# Patient Record
Sex: Female | Born: 1950
Health system: Southern US, Community
[De-identification: ages and names within clinical notes are randomized; demographics above are authoritative.]

## PROBLEM LIST (undated history)

## (undated) DIAGNOSIS — Z923 Personal history of irradiation: Secondary | ICD-10-CM

## (undated) DIAGNOSIS — Z8489 Family history of other specified conditions: Secondary | ICD-10-CM

## (undated) DIAGNOSIS — E785 Hyperlipidemia, unspecified: Secondary | ICD-10-CM

## (undated) DIAGNOSIS — M1990 Unspecified osteoarthritis, unspecified site: Secondary | ICD-10-CM

## (undated) DIAGNOSIS — M8580 Other specified disorders of bone density and structure, unspecified site: Secondary | ICD-10-CM

## (undated) DIAGNOSIS — C541 Malignant neoplasm of endometrium: Secondary | ICD-10-CM

## (undated) DIAGNOSIS — E669 Obesity, unspecified: Secondary | ICD-10-CM

## (undated) DIAGNOSIS — T7840XA Allergy, unspecified, initial encounter: Secondary | ICD-10-CM

## (undated) DIAGNOSIS — I10 Essential (primary) hypertension: Secondary | ICD-10-CM

## (undated) DIAGNOSIS — H5440 Blindness, one eye, unspecified eye: Secondary | ICD-10-CM

## (undated) DIAGNOSIS — H3320 Serous retinal detachment, unspecified eye: Secondary | ICD-10-CM

## (undated) HISTORY — DX: Family history of other specified conditions: Z84.89

## (undated) HISTORY — DX: Personal history of irradiation: Z92.3

## (undated) HISTORY — DX: Unspecified osteoarthritis, unspecified site: M19.90

## (undated) HISTORY — DX: Blindness, one eye, unspecified eye: H54.40

## (undated) HISTORY — DX: Obesity, unspecified: E66.9

## (undated) HISTORY — DX: Serous retinal detachment, unspecified eye: H33.20

## (undated) HISTORY — DX: Other specified disorders of bone density and structure, unspecified site: M85.80

## (undated) HISTORY — DX: Malignant neoplasm of endometrium: C54.1

## (undated) HISTORY — DX: Allergy, unspecified, initial encounter: T78.40XA

## (undated) HISTORY — DX: Hyperlipidemia, unspecified: E78.5

---
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
## 2011-02-15 ENCOUNTER — Other Ambulatory Visit (HOSPITAL_COMMUNITY)
Admission: RE | Admit: 2011-02-15 | Discharge: 2011-02-15 | Disposition: A | Discharge status: Home / Self Care | Payer: BC Managed Care – PPO | Source: Ambulatory Visit | Attending: Obstetrics and Gynecology | Admitting: Obstetrics and Gynecology

## 2011-02-15 DIAGNOSIS — Z01419 Encounter for gynecological examination (general) (routine) without abnormal findings: Secondary | ICD-10-CM | POA: Insufficient documentation

## 2011-02-23 ENCOUNTER — Other Ambulatory Visit: Payer: Self-pay | Admitting: Obstetrics and Gynecology

## 2011-02-23 DIAGNOSIS — Z1231 Encounter for screening mammogram for malignant neoplasm of breast: Secondary | ICD-10-CM

## 2011-03-09 ENCOUNTER — Ambulatory Visit
Admission: RE | Admit: 2011-03-09 | Discharge: 2011-03-09 | Disposition: A | Discharge status: Home / Self Care | Payer: BC Managed Care – PPO | Source: Ambulatory Visit | Attending: Obstetrics and Gynecology | Admitting: Obstetrics and Gynecology

## 2011-03-09 DIAGNOSIS — Z1231 Encounter for screening mammogram for malignant neoplasm of breast: Secondary | ICD-10-CM

## 2011-03-10 ENCOUNTER — Other Ambulatory Visit: Payer: Self-pay | Admitting: Obstetrics and Gynecology

## 2011-03-10 DIAGNOSIS — R928 Other abnormal and inconclusive findings on diagnostic imaging of breast: Secondary | ICD-10-CM

## 2011-03-14 ENCOUNTER — Ambulatory Visit
Admission: RE | Admit: 2011-03-14 | Discharge: 2011-03-14 | Disposition: A | Discharge status: Home / Self Care | Payer: BC Managed Care – PPO | Source: Ambulatory Visit | Attending: Obstetrics and Gynecology | Admitting: Obstetrics and Gynecology

## 2011-03-14 DIAGNOSIS — R928 Other abnormal and inconclusive findings on diagnostic imaging of breast: Secondary | ICD-10-CM

## 2011-03-29 ENCOUNTER — Ambulatory Visit: Payer: BC Managed Care – PPO | Attending: Gynecologic Oncology | Admitting: Gynecologic Oncology

## 2011-03-29 DIAGNOSIS — D235 Other benign neoplasm of skin of trunk: Secondary | ICD-10-CM | POA: Insufficient documentation

## 2011-03-29 DIAGNOSIS — Z800 Family history of malignant neoplasm of digestive organs: Secondary | ICD-10-CM | POA: Insufficient documentation

## 2011-03-29 DIAGNOSIS — D259 Leiomyoma of uterus, unspecified: Secondary | ICD-10-CM | POA: Insufficient documentation

## 2011-03-29 DIAGNOSIS — N950 Postmenopausal bleeding: Secondary | ICD-10-CM | POA: Insufficient documentation

## 2011-03-29 DIAGNOSIS — Z87891 Personal history of nicotine dependence: Secondary | ICD-10-CM | POA: Insufficient documentation

## 2011-03-29 DIAGNOSIS — N8502 Endometrial intraepithelial neoplasia [EIN]: Secondary | ICD-10-CM | POA: Insufficient documentation

## 2011-03-30 NOTE — Consult Note (Signed)
NAMEALOMA, [name]           ACCOUNT NO.:  [id number]  MEDICAL RECORD NO.:  [id number]           PATIENT TYPE:  O  LOCATION:  GYN                          FACILITY:  [facility]  PHYSICIAN:  Gearline Spilman A. Duard Brady, MD    DATE OF BIRTH:  1951/05/25  DATE OF CONSULTATION:  03/29/2011 DATE OF DISCHARGE:                                CONSULTATION   REASON FOR CONSULTATION:  The patient is seen today in consultation at the request of Dr. Gevena Cotton.  HISTORY:  Ms. [name] is a very pleasant 60 year old gravida 0, para 0 whose last cycle was about 10 years ago.  She presents with about 6 months' history of postmenopausal bleeding.  There has really been no pain associated with it.  It was bright red.  It was fairly sporadic. There was no pattern to it.  She has really had no bleeding for the past month.  She did see Dr. Gevena Cotton and underwent an ultrasound on February 23, 2011, that revealed the uterus to be 4.4 x 6.5 x 4.4 cm.  Normal appearing adnexa.  There were small anterior fibroids measuring 2 to 3 cm.  The endometrium was thickened, measuring about 1.1 x 0.9 cm with blood flow throughout the endometrial cavity.  She underwent an endometrial biopsy on April 4 that revealed at least complex atypical hyperplasia.  Could not rule out endometrioid adenocarcinoma.  She otherwise has no complaints.  She has had very limited health care.  She had a mammogram last week.  She has never had a colonoscopy.  The mammogram that she had last week was her first one.  REVIEW OF SYSTEMS:  She denies any chest pain, shortness of breath, nausea, vomiting, fevers, chills, headaches or visual changes.  She does complain of a lump on her right side that she felt recently.  MEDICATIONS:  Claritin p.r.n. and Benadryl p.r.n.  PAST MEDICAL HISTORY:  None, but health care has been limited.  PAST SURGICAL HISTORY:  Wisdom tooth extraction in 1973.  SOCIAL HISTORY:  She has a history of smoking half a pack per day  for approximately a 30 pack year history.  However, she quit two months ago. She drinks about one drink per month.  She works as an Airline pilot at [employer].  She is single.  FAMILY HISTORY:  Her father died of a cardiac aneurysm at the age of 31. Her mother died at the age of 20.  She also had colon cancer.  She has a brother who died of colon cancer at the age of 50.  She has a brother who has kidney failure and he also has malignant hyperthermia.  She herself has been tested and was "borderline".  HEALTH MAINTENANCE:  She has never used any birth control.  She has never had a Pap smear.  She has never had a mammogram prior to the one last week.  She has never had a colonoscopy.  PHYSICAL EXAMINATION:  VITAL SIGNS:  Height 5 feet 9, weight 198 pounds, BMI 28, blood pressure 122/88, pulse 64, respirations 16. GENERAL:  A well-nourished, well-developed female in no acute distress. NECK:  Supple.  There is no  lymphadenopathy and no thyromegaly. LUNGS:  Clear to auscultation bilaterally. CARDIOVASCULAR EXAMINATION:  Regular rate and rhythm. ABDOMEN:  Soft, nontender, nondistended.  There is no palpable masses or hepatosplenomegaly. SKIN:  She has three small nevi on her right side and on the middle of the three small nevi there is a palpable lump measuring approximately 1.5 cm that is mobile, consistent with a lipoma. GROINS:  Negative for adenopathy. EXTREMITIES:  There is no edema. PELVIC:  External genitalia is within normal limits.  The vagina is slightly atrophic.  The cervix is visualized.  It is without lesions. It is nulliparous.  On bimanual examination the cervix is palpably normal.  The corpus is of normal size, shape and consistency.  There is no adnexal masses.  The introitus is somewhat narrowed.  ASSESSMENT AND PLAN: 72. A 60 year old with at least complex hyperplasia with atypia based     on endometrial biopsy.  I discussed with her that recommendations     would be to  proceed with a hysterectomy and removal of the adnexa.     Frozen section would be performed to determine the need for any     lymphadenectomy.  I discussed with her doing this robotically and     she is in agreement.  The risks and benefits of the procedure     including, but not limited to, bleeding, infection, injury to     surrounding organs and thromboembolic disease were discussed with     the patient.  I also discussed with her that we would send her     tumor for microsatellite instability testing due to her family     history of colon cancer in her mother and brother to ensure that     she is not carrier genetic predisposition.  She has never had a     colonoscopy and has a referral in for that and that can be done     preoperatively based on scheduling. 2. Her brother has a history of malignant hyperthermia.  She herself     was tested many, many years ago and is "borderline".  She has never     had general anesthesia.  She will require Anesthesia preoperative     clearance. 3. Her questions were elicited and answered to her satisfaction.  She has    some time commitments and wished to have surgery June 26th.  We will     arrange surgery accordingly   She has our contact information and    can contact us with any questions prior to that.     Shawnika Pepin A. Duard Brady, MD     PAG/MEDQ  D:  03/29/2011  T:  03/29/2011  Job:  [id number]  cc:   Patsy Baltimore, MD Fax: [phone]  Telford Nab, R.N. [address] [place], [place] [address]  Anna Genre. Little, M.D. Fax: [phone]  Electronically Signed by Cleda Mccreedy MD on 03/30/2011 03:17:47 PM

## 2011-05-05 ENCOUNTER — Other Ambulatory Visit: Payer: Self-pay | Admitting: Gynecologic Oncology

## 2011-05-05 ENCOUNTER — Encounter (HOSPITAL_COMMUNITY): Payer: BC Managed Care – PPO

## 2011-05-05 LAB — COMPREHENSIVE METABOLIC PANEL WITH GFR
ALT: 13 U/L (ref 0–35)
AST: 13 U/L (ref 0–37)
Albumin: 4.2 g/dL (ref 3.5–5.2)
Alkaline Phosphatase: 71 U/L (ref 39–117)
BUN: 11 mg/dL (ref 6–23)
CO2: 26 meq/L (ref 19–32)
Calcium: 9.9 mg/dL (ref 8.4–10.5)
Chloride: 109 meq/L (ref 96–112)
Creatinine, Ser: 0.64 mg/dL (ref 0.50–1.10)
GFR calc Af Amer: >60 mL/min
GFR calc non Af Amer: >60 mL/min
Glucose, Bld: 90 mg/dL (ref 70–99)
Potassium: 4.6 meq/L (ref 3.5–5.1)
Sodium: 144 meq/L (ref 135–145)
Total Bilirubin: 0.3 mg/dL (ref 0.3–1.2)
Total Protein: 6.9 g/dL (ref 6.0–8.3)

## 2011-05-05 LAB — DIFFERENTIAL
Eosinophils Relative: 0 % (ref 0–5)
Lymphocytes Relative: 15 % (ref 12–46)
Lymphs Abs: 1.3 10*3/uL (ref 0.7–4.0)
Monocytes Relative: 6 % (ref 3–12)

## 2011-05-05 LAB — CBC
MCHC: 33 g/dL (ref 30.0–36.0)
Platelets: 230 10*3/uL (ref 150–400)
RDW: 12.9 % (ref 11.5–15.5)

## 2011-05-05 LAB — SURGICAL PCR SCREEN: Staphylococcus aureus: NEGATIVE

## 2011-05-09 ENCOUNTER — Ambulatory Visit (HOSPITAL_COMMUNITY)
Admission: RE | Admit: 2011-05-09 | Discharge: 2011-05-10 | Disposition: A | Discharge status: Home / Self Care | Payer: BC Managed Care – PPO | Source: Ambulatory Visit | Attending: Obstetrics & Gynecology | Admitting: Obstetrics & Gynecology

## 2011-05-09 ENCOUNTER — Other Ambulatory Visit: Payer: Self-pay | Admitting: Gynecologic Oncology

## 2011-05-09 DIAGNOSIS — Z79899 Other long term (current) drug therapy: Secondary | ICD-10-CM | POA: Insufficient documentation

## 2011-05-09 DIAGNOSIS — Z01811 Encounter for preprocedural respiratory examination: Secondary | ICD-10-CM | POA: Insufficient documentation

## 2011-05-09 DIAGNOSIS — Z01812 Encounter for preprocedural laboratory examination: Secondary | ICD-10-CM | POA: Insufficient documentation

## 2011-05-09 DIAGNOSIS — C55 Malignant neoplasm of uterus, part unspecified: Secondary | ICD-10-CM | POA: Insufficient documentation

## 2011-05-09 DIAGNOSIS — Z01810 Encounter for preprocedural cardiovascular examination: Secondary | ICD-10-CM | POA: Insufficient documentation

## 2011-05-09 DIAGNOSIS — N8502 Endometrial intraepithelial neoplasia [EIN]: Secondary | ICD-10-CM | POA: Insufficient documentation

## 2011-05-09 HISTORY — PX: ABDOMINAL HYSTERECTOMY: SHX81

## 2011-05-09 LAB — SAMPLE TO BLOOD BANK

## 2011-05-10 LAB — CBC
Hemoglobin: 13.2 g/dL (ref 12.0–15.0)
MCH: 28.9 pg (ref 26.0–34.0)
MCV: 88.8 fL (ref 78.0–100.0)
RBC: 4.56 MIL/uL (ref 3.87–5.11)
WBC: 14.2 10*3/uL — ABNORMAL HIGH (ref 4.0–10.5)

## 2011-05-12 NOTE — Op Note (Signed)
NAMELONA, SIX           ACCOUNT NO.:  [id number]  MEDICAL RECORD NO.:  [id number]  LOCATION:  1529                         FACILITY:  [facility]  PHYSICIAN:  Hadessah Grennan A. Duard Brady, MD    DATE OF BIRTH:  [birth year]  DATE OF PROCEDURE:  05/09/2011 DATE OF DISCHARGE:                              OPERATIVE REPORT   PREOPERATIVE DIAGNOSIS:  Complex hyperplasia with atypia.  POSTOPERATIVE DIAGNOSIS:  Grade 1 endometrioid adenocarcinoma with at least 50% myometrial invasion.  PROCEDURE:  Total robotic hysterectomy, bilateral salpingo-oophorectomy, bilateral pelvic and bilateral para-aortic lymph node dissection.  SURGEON: 1. Alla Sloma A. Duard Brady, MD 2. Roseanna Rainbow, M.D.  ASSISTANT:  Telford Nab, R.N.  ANESTHESIA:  General.  ESTIMATED BLOOD LOSS:  Minimal.  URINE OUTPUT:  450 mL.  IV FLUIDS:  2000 mL.  COMPLICATIONS:  None.  SPECIMEN:  Included cervix, uterus, bilateral tubes and ovaries, bilateral pelvic and bilateral para-aortic lymph nodes to pathology.  OPERATIVE FINDINGS:  Included some physiologic adhesive disease of the rectosigmoid colon to the left pelvic sidewall and the cecum to the anterior abdominal wall.  Grossly there was no evidence of extrauterine disease.  Frozen section revealed grade 1 endometrioid adenocarcinoma with at least if not more than greater than 50% myometrial invasion.  DESCRIPTION OF PROCEDURE:  The patient was identified in the preoperative holding area as self.  Informed consent was signed on the chart.  Risks and benefits of the procedure were discussed with the patient, reviewed and she wished to proceed.  The patient was then taken to the operating room and placed in supine position.  Her arms were tucked to her side with appropriate precautions while the patient was awake to ensure comfort and safety.  General anesthesia was then induced and an OG tube was placed to suction.  She was then placed in lithotomy position with  all appropriate precautions.  The shoulder blocks were placed in the usual fashion with appropriate precautions.  Time-out was performed to confirm the patient, the procedure, antibiotic, allergy status.  The perineum was closed with Betadine cleaning solution.  Foley catheter in the bladder under sterile conditions.  The patient had incredibly narrow introitus.  Sterile speculum was placed in the vagina. The cervix was grasped with single-tooth tenaculum, dilated without difficulty.  This ZUMI with a small ring was placed without difficulty. The abdomen was then prepped in usual fashion.  After waiting 3 minutes for the ChloraPrep to dry, the patient was then draped.  A second time- out was performed to confirm the patient, the procedure, antibiotic and allergy status.  OG tube placement was confirmed and to suction. Approximately 2 cm below the costal margin, midclavicular line, the skin was anesthetized using 1% lidocaine.  A 5-mm incision was made and using 5-mm Optiview direct placement of intra-abdominal port placement.  The patient's abdomen was insufflated with CO2 gas.  At this point and all points during the procedure, the patient's intra-abdominal pressure did not increase over 50 mmHg.  A 10-12 camera port was placed 24 cm above the pubic symphysis.  Bilateral 8-mm ports were then placed for the robot 10 cm lateral and 15 degrees down.  Again all these ports were placed under  direct visualization.  The small bowel was folded on its mesentery to allow access to the para-aortic lymph nodes.  The robot was docked.  We transected the round ligament with monopolar cautery and the anterior and posterior leaves of broad ligament were opened.  The ureter was identified.  The pararectal space was opened.  A window was made between IP and the ureter.  The IP was coagulated with bipolar cautery and transected.  The uterine vessels were then skeletonized posteriorly to the level of the  KOH ring.  Anteriorly using the KOH ring as a guide, the bladder flap was created to a level below the KOH.  The uterine vessels after skeletonization were coagulated and transected.  We then turned our attention to the left side.  The physiologic adhesions of the rectosigmoid colon to the left adnexa on the left pelvic sidewall were taken down using sharp dissection.  A similar procedure was performed and then we transected the round ligament.  The anterior and posterior leaves of the broad ligament were opened and the ureter was identified. The pararectal space was opened.  The uterine vessels were then skeletonized and bladder flap was completed..  The uterine vessels on the left side were coagulated with bipolar cautery and transected.  At this time, the pneumo-occluder balloon was insufflated and a colpotomy was performed.  The cervix, uterus, bilateral tubes and ovaries were delivered through the vagina without difficulty.  The pneumo-occluder balloon was replaced.  While awaiting frozen section, the paravesical spaces bilaterally were opened without difficulty.  Continued to open up the spaces for the pelvic lymph node dissection at this point, the frozen section returned as a grade 1 cancer with at least 50% myometrial invasion.  Our attention was first drawn to the nodal bundle on the patient's right side.  Extending to the level of the common, the nodal bundle extending over the external iliac artery was removed using monopolar cautery.  The genitofemoral nerve was identified and spared. This was continued down to the level of circumflex iliac.  We then came down medially and identified the external iliac vein and the nodal bundle was removed from that and transected.  The grasper from the assistant port was then placed in the paravesical space.  The obturator nerve was identified.  The nodal bundle superior to the obturator nerve was taken down using sharp dissection.  There was  an accessory obturator artery coursing through the nodal bundle and this was spared with careful dissection.  A similar procedure was performed on the patient's left side.  Both nodal specimens were placed in EndoCatch bag.  Our attention was then drawn to the para-aortics. There was good and adequate exposure.  An incision was conducted from the common up towards the duodenum using monopolar cautery.  The retroperitoneal space was opened.  The psoas tendon was identified and the ureter was mobilized superiorly with assistant port.  The nodal bundle extending over the vena cava was taken down using sharp dissection and pinpoint cautery. There was no pathologically enlarged nodes.  This was performed to the level of the duodenum.  The specimens were then delivered through the 10- 12 assistant port.  After obtaining hemostasis, we then turned to the left para-aortic lymph nodes.  The IMA was identified.  The peritoneum superior to the IMA was dissected open.  The retroperitoneal space was opened and the psoas tendon were identified and the ureter was held lateral with the grasper port.  The left-sided paraaortic lymph node  bundle was removed using sharp dissection and pinpoint cautery as indicated.  It was then delivered through the 10-12 port.  Ray-Tec was placed in the space and there was a small bleeder.  Reinspection confirmed that the bleeding had ceased and cautery to this area was performed.  All specimens in the EndoCatch bag and Ray-Tec were then delivered through the vagina and the pneumo-occluder balloon was replaced.  All pedicle sites including the nodal bundles sites were inspected and noted be hemostatic.  The vaginal cuff was closed using a 0 Vicryl on the CT-1 suture in a running fashion.  The abdomen and pelvis were copiously irrigated.  The needle was removed without difficulty.  The port sites and pedicles were inspected under low flow and noted be hemostatic.  The  instruments were then removed and under direct visualization the robotic ports were removed.  The pneumoperitoneum was then removed from the patient's abdomen.  The robot was undocked.  The 10-12 supraumbilical and 10-12 infracostal incisions had deep sutures of 0 Vicryl on UR-6.  Skin was closed using 4-0 Vicryl, Steri- Strips and Benzoin were applied.  The vagina was swabbed and the vaginal cuff noted to be hemostatic.  There was a small tear on the vaginal mucosa due to very narrow introitus.  Two figure-of-eight sutures were placed in this area and there was adequate hemostasis.  The patient tolerated the procedure well, was taken to the recovery room stable condition.  All instrument, needle, Ray-Tec counts were correct x2.     Tarini Carrier A. Duard Brady, MD     PAG/MEDQ  D:  05/09/2011  T:  05/09/2011  Job:  [id number]  cc:   Roseanna Rainbow, M.D. Fax: [phone]  Telford Nab, R.N. [address] [place], [place] [address]  Anna Genre. Little, M.D. Fax: [phone]  Patsy Baltimore, MD Fax: [phone]  Electronically Signed by Cleda Mccreedy MD on 05/12/2011 07:39:30 AM

## 2011-05-31 ENCOUNTER — Ambulatory Visit: Payer: BC Managed Care – PPO | Attending: Gynecologic Oncology | Admitting: Gynecologic Oncology

## 2011-05-31 DIAGNOSIS — C55 Malignant neoplasm of uterus, part unspecified: Secondary | ICD-10-CM | POA: Insufficient documentation

## 2011-06-02 NOTE — Consult Note (Signed)
NAMEAUBREANNA, [name]           ACCOUNT NO.:  [id number]  MEDICAL RECORD NO.:  [id number]  LOCATION:  GYN                          FACILITY:  [facility]  PHYSICIAN:  Severino Paolo A. Duard Brady, MD    DATE OF BIRTH:  07-01-1951  DATE OF CONSULTATION:  05/31/2011 DATE OF DISCHARGE:                                CONSULTATION   HISTORY OF PRESENT ILLNESS:  The patient is very pleasant 60 year old who is referred by Dr. Gevena Cotton.  She had a 18-month history of postmenopausal bleeding that was bright red.  She underwent an endometrial biopsy that revealed at least complex hyperplasia with atypia.  Subsequently on May 09, 2011, she underwent robotic-assisted hysterectomy, bilateral salpingo-oophorectomy.  Frozen section with a grade 1 endometrioid adenocarcinoma with at least if not more than 50% myometrial invasion.  Final pathology revealed a grade 2 endometrioid adenocarcinoma with deep myometrial invasion of 0.6 to 1.1 cm for a total of approximately 55%.  There was lymphovascular space involvement with a grade 2 lesion.  0 out of 11 lymph nodes were involved, 0 out of 1 left pelvic, 0 out of 4 right pelvic, 0 out of 5 right periaortic and 0 out of 1 left periaortic nodes being involved.  Adnexa were negative. She is therefore stage IB grade 2 endometrioid adenocarcinoma.  She comes in today for her postoperative check.  She really describes the surgery as a "nonevent."  She states she had no pain.  She has had no bleeding and feels like she is ready to go back to work today.  She and I had a lengthy discussion, spending at least 15 minutes face-to-face time discussing her pathology.  We reviewed the GOG data and at her age, she would require three risk factor, she has two including lymphovascular space involvement and grade 2 disease but she does not have sufficient myometrial invasion, though the cut off on the GOG was 66% and she has 55%.  After we discussed that, she would at least want to  speak with radiation-oncology and she is actually probably most likely interested in proceeding with postoperative vaginal cuff brachytherapy.  We had a discussion briefly regarding the pros and cons of radiation therapy.  She states she would like to be as conservative as possible and if it is something that could be done with minimal risks that would potentially decrease her risks even more, it would be something that she be willing to consider.  PHYSICAL EXAMINATION:  VITAL SIGNS:  Weight 202 pounds, height 5 feet 9 inches, blood pressure 140/80, temperature 97.8, respirations 18. GENERAL:  Well-nourished, well-developed female in no acute distress. ABDOMEN:  Well-healed surgical incisions.  No evidence of any incisional hernias. PELVIC:  External genitalia is within normal limits.  The vagina is somewhat atrophic.  The vaginal cuff is visualized.  It is healing well. Suture line intact.  There is no nodularity.  There is no fluctuance. It is nontender.  ASSESSMENT:  59-year with stage IB grade 2 endometrioid adenocarcinoma.  PLAN:  She has an appointment to see Dr. Antony Blackbird on June 14, 2011 for evaluation and consideration and discussion of postoperative radiation therapy.  Will see her in 4 months.  She will  be followed every 4 months for the first 3 years and after that point, she will be followed every 6 months for a total of 5-year followup.     [name] A. Duard Brady, MD     PAG/MEDQ  D:  05/31/2011  T:  05/31/2011  Job:  [id number]  cc:   Patsy Baltimore, MD Fax: [phone]  Anna Genre. Little, M.D. Fax: [phone]  Telford Nab, R.N. [address] [place], [place] [address]  Billie Lade, Ph.D., M.D. Fax: [phone]  Electronically Signed by Cleda Mccreedy MD on 06/02/2011 10:26:32 AM

## 2011-06-12 ENCOUNTER — Ambulatory Visit (INDEPENDENT_AMBULATORY_CARE_PROVIDER_SITE_OTHER): Payer: BC Managed Care – PPO | Admitting: Ophthalmology

## 2011-06-12 DIAGNOSIS — H33309 Unspecified retinal break, unspecified eye: Secondary | ICD-10-CM

## 2011-06-14 ENCOUNTER — Ambulatory Visit
Admission: RE | Admit: 2011-06-14 | Discharge: 2011-06-14 | Disposition: A | Discharge status: Home / Self Care | Payer: BC Managed Care – PPO | Source: Ambulatory Visit | Attending: Radiation Oncology | Admitting: Radiation Oncology

## 2011-06-14 DIAGNOSIS — Z800 Family history of malignant neoplasm of digestive organs: Secondary | ICD-10-CM | POA: Insufficient documentation

## 2011-06-14 DIAGNOSIS — Z510 Encounter for antineoplastic radiation therapy: Secondary | ICD-10-CM | POA: Insufficient documentation

## 2011-06-14 DIAGNOSIS — C549 Malignant neoplasm of corpus uteri, unspecified: Secondary | ICD-10-CM | POA: Insufficient documentation

## 2011-06-16 ENCOUNTER — Other Ambulatory Visit (INDEPENDENT_AMBULATORY_CARE_PROVIDER_SITE_OTHER): Payer: BC Managed Care – PPO | Admitting: Ophthalmology

## 2011-06-16 DIAGNOSIS — H33309 Unspecified retinal break, unspecified eye: Secondary | ICD-10-CM

## 2011-06-30 ENCOUNTER — Encounter (INDEPENDENT_AMBULATORY_CARE_PROVIDER_SITE_OTHER): Payer: BC Managed Care – PPO | Admitting: Ophthalmology

## 2011-06-30 DIAGNOSIS — H33309 Unspecified retinal break, unspecified eye: Secondary | ICD-10-CM

## 2011-09-05 ENCOUNTER — Ambulatory Visit
Admission: RE | Admit: 2011-09-05 | Discharge: 2011-09-05 | Disposition: A | Discharge status: Home / Self Care | Payer: BC Managed Care – PPO | Source: Ambulatory Visit | Attending: Radiation Oncology | Admitting: Radiation Oncology

## 2011-10-30 ENCOUNTER — Ambulatory Visit (INDEPENDENT_AMBULATORY_CARE_PROVIDER_SITE_OTHER): Payer: BC Managed Care – PPO | Admitting: Ophthalmology

## 2011-10-30 DIAGNOSIS — H3530 Unspecified macular degeneration: Secondary | ICD-10-CM

## 2011-10-30 DIAGNOSIS — H35419 Lattice degeneration of retina, unspecified eye: Secondary | ICD-10-CM

## 2011-10-30 DIAGNOSIS — H2510 Age-related nuclear cataract, unspecified eye: Secondary | ICD-10-CM

## 2011-10-30 DIAGNOSIS — H33309 Unspecified retinal break, unspecified eye: Secondary | ICD-10-CM

## 2011-10-30 DIAGNOSIS — H43819 Vitreous degeneration, unspecified eye: Secondary | ICD-10-CM

## 2011-10-31 ENCOUNTER — Encounter: Payer: Self-pay | Admitting: Gynecologic Oncology

## 2011-11-01 ENCOUNTER — Other Ambulatory Visit (HOSPITAL_COMMUNITY)
Admission: RE | Admit: 2011-11-01 | Discharge: 2011-11-01 | Disposition: A | Discharge status: Home / Self Care | Payer: BC Managed Care – PPO | Source: Ambulatory Visit | Attending: Gynecologic Oncology | Admitting: Gynecologic Oncology

## 2011-11-01 ENCOUNTER — Encounter: Payer: Self-pay | Admitting: Gynecologic Oncology

## 2011-11-01 ENCOUNTER — Ambulatory Visit: Payer: BC Managed Care – PPO | Attending: Gynecologic Oncology | Admitting: Gynecologic Oncology

## 2011-11-01 VITALS — BP 148/100 | HR 88 | Temp 98.7°F | Resp 20 | Ht 69.5 in | Wt 221.2 lb

## 2011-11-01 DIAGNOSIS — C541 Malignant neoplasm of endometrium: Secondary | ICD-10-CM | POA: Insufficient documentation

## 2011-11-01 DIAGNOSIS — Z01419 Encounter for gynecological examination (general) (routine) without abnormal findings: Secondary | ICD-10-CM | POA: Insufficient documentation

## 2011-11-01 DIAGNOSIS — Z90710 Acquired absence of both cervix and uterus: Secondary | ICD-10-CM | POA: Insufficient documentation

## 2011-11-01 DIAGNOSIS — C549 Malignant neoplasm of corpus uteri, unspecified: Secondary | ICD-10-CM | POA: Insufficient documentation

## 2011-11-01 NOTE — Patient Instructions (Signed)
Return to see Dr. Roselind Messier in 4 months and return to see me in 8 months.

## 2011-11-01 NOTE — Progress Notes (Signed)
Consult Note: Gyn-Onc  [name] 60 y.o. female  CC:  Chief Complaint  Patient presents with  . Follow-up    Endo ca    EAV:[id number] OF PRESENT ILLNESS: The patient is very pleasant 60 year old who is referred by Dr. Gevena Cotton. She had a 92-month history of  postmenopausal bleeding that was bright red. She underwent an endometrial biopsy that revealed at least complex hyperplasia with  atypia. Subsequently on May 09, 2011, she underwent robotic-assisted hysterectomy, bilateral salpingo-oophorectomy. Frozen section with a  grade 1 endometrioid adenocarcinoma with at least if not more than 50% myometrial invasion. Final pathology revealed a grade 2 endometrioid  adenocarcinoma with deep myometrial invasion of 0.6 to 1.1 cm for a total of approximately 55%. There was lymphovascular space involvement  with a grade 2 lesion. 0 out of 11 lymph nodes were involved, 0 out of 1 left pelvic, 0 out of 4 right pelvic, 0 out of 5 right periaortic and  0 out of 1 left periaortic nodes being involved. Adnexa were negative. She is therefore stage IB grade 2 endometrioid adenocarcinoma. We reviewed the GOG data and at her age, she would require three risk factor, she has two including lymphovascular space involvement and grade 2 disease but she does not have sufficient myometrial invasion, though the cut off on the GOG was 66% and she has 55%. After we discussed that, she would at least want to speak with radiation-oncology and she ultimately decided to proceed with vaginal cuff brachytherapy.      Interval History: She tolerated her brachytherapy well. She had quite a bit of fatigue from that. She has noticed that over the past few weeks her energy level has improved.  She said that she is essentially just been eating and sleeping for the last 6 months. Her weight has gone up 19 pounds since we saw her in July. She will he admits to eating quite poorly for the past 6 months. She states that now that her  energy level is improving she is really going to make a concerted effort to start exercising and to watch her diet. She has taken some Claritin for some allergy symptoms. Otherwise, review of systems for 10 points is completely negative.  Review of Systems: 10 point review of systems is negative  Current Meds:  Outpatient Encounter Prescriptions as of 11/01/2011  Medication Sig Dispense Refill  . Loratadine (CLARITIN PO) Take by mouth as needed.         Allergy: No Known Allergies  Social Hx:   History   Social History  . Marital Status: Single    Spouse Name: N/A    Number of Children: N/A  . Years of Education: N/A   Occupational History  . Not on file.   Social History Main Topics  . Smoking status: Former Smoker -- 0.5 packs/day for 30 years    Types: Cigarettes    Quit date: 01/12/2011  . Smokeless tobacco: Not on file  . Alcohol Use: Yes     rarely  . Drug Use: No  . Sexually Active: No   Other Topics Concern  . Not on file   Social History Narrative  . No narrative on file    Past Surgical Hx:  Past Surgical History  Procedure Date  . Wisdom tooth extraction 1973  . Abdominal hysterectomy 05/09/11    Robotic TAH, BSO, and LND    Past Medical Hx:  Past Medical History  Diagnosis Date  . Endometrial adenocarcinoma  Stage IB grade 2    Family Hx:  Family History  Problem Relation Age of Onset  . Colon cancer Mother   . Aneurysm Father   . Colon cancer Brother   . Kidney failure Other     Vitals:  Blood pressure 148/100, pulse 88, temperature 98.7 F (37.1 C), resp. rate 20, height 5' 9.5" (1.765 m), weight 221 lb 3.2 oz (100.336 kg).  Physical Exam: Well-nourished well-developed female in no acute distress.  Neck: No lymphadenopathy no thyromegaly.  Lungs: Clear to auscultation bilaterally.  Cardiovascular exam: Regular rate and rhythm.  Abdomen: Well-healed surgical incisions. Abdomen is soft nontender nondistended there are no  palpable masses or hepatosplenomegaly.  Groins: No lymphadenopathy.  Pelvic: External genitalia within normal limits. The vagina is atrophic. The vaginal cuff is without lesions. ThinPrep Pap smear was admitted. Bimanual examination reveals no masses or nodularity. Rectal confirms.  Extremities: No edema  Assessment/Plan:  60 year old with a stage IB grade 2 endometrioid adenocarcinoma and is doing well after undergoing surgery and vaginal cuff brachytherapy. Plan: We'll followup on the results of her Pap smear from today. She will see Dr. Roselind Messier in 4 months and return to see me in 8 months.   Tzion Wedel A., MD 11/01/2011, 8:46 AM

## 2011-11-01 NOTE — Progress Notes (Signed)
Addended by: Randel Pigg on: 11/01/2011 09:17 AM   Modules accepted: Orders

## 2011-11-03 ENCOUNTER — Encounter (INDEPENDENT_AMBULATORY_CARE_PROVIDER_SITE_OTHER): Payer: BC Managed Care – PPO | Admitting: Ophthalmology

## 2011-11-03 DIAGNOSIS — H33309 Unspecified retinal break, unspecified eye: Secondary | ICD-10-CM

## 2011-11-06 ENCOUNTER — Telehealth: Payer: Self-pay | Admitting: *Deleted

## 2011-11-06 NOTE — Telephone Encounter (Signed)
Called pt home, left voice message, pap results negative for malignancy,good news,can call back if any questins [phone], to this RN 10:11 AM

## 2011-11-17 ENCOUNTER — Encounter (INDEPENDENT_AMBULATORY_CARE_PROVIDER_SITE_OTHER): Payer: BC Managed Care – PPO | Admitting: Ophthalmology

## 2011-11-17 DIAGNOSIS — H33309 Unspecified retinal break, unspecified eye: Secondary | ICD-10-CM

## 2011-11-20 ENCOUNTER — Telehealth: Payer: Self-pay | Admitting: *Deleted

## 2011-11-20 NOTE — Telephone Encounter (Signed)
Message left for pt with PAP results  

## 2012-01-15 ENCOUNTER — Encounter: Payer: Self-pay | Admitting: Radiation Oncology

## 2012-01-15 ENCOUNTER — Ambulatory Visit
Admission: RE | Admit: 2012-01-15 | Discharge: 2012-01-15 | Disposition: A | Discharge status: Home / Self Care | Payer: BC Managed Care – PPO | Source: Ambulatory Visit | Attending: Radiation Oncology | Admitting: Radiation Oncology

## 2012-01-15 VITALS — BP 138/91 | HR 98 | Temp 98.6°F | Wt 228.7 lb

## 2012-01-15 DIAGNOSIS — Z01419 Encounter for gynecological examination (general) (routine) without abnormal findings: Secondary | ICD-10-CM | POA: Insufficient documentation

## 2012-01-15 DIAGNOSIS — C541 Malignant neoplasm of endometrium: Secondary | ICD-10-CM

## 2012-01-15 NOTE — Progress Notes (Signed)
Here for routine follow post completion of radiation in September 2012 for endometrial cancer. Denies pain. No dysuria but does have some urgency. Continues to use dilator intermittently.

## 2012-01-15 NOTE — Progress Notes (Signed)
CC:   Mary A. Duard Brady, MD Anna Genre Little, M.D.  DIAGNOSIS:  Stage I-b, grade 2 endometrioid adenocarcinoma.  INTERVAL SINCE RADIATION THERAPY:  6 months.  NARRATIVE:  [name] comes in today for routine followup.  She completed intracavitary brachytherapy alone as part of her overall management.  Clinically the patient seems to be doing quite well at this time.  She did see Dr. Duard Winters in December with a good report at that time.  The patient is using her vaginal dilator intermittently.  She denies any vaginal bleeding, urination difficulties or bowel complaints.  PHYSICAL EXAMINATION:  The patient's temperature is 98.6, pulse 98, blood pressure is 138/91, weight is 228.7 pounds.  Examination of the neck and supraclavicular region reveals no evidence of adenopathy.  The axillary areas are free of adenopathy.  Examination of the lungs reveals them to be clear.  The heart has a regular rhythm and rate.  Examination of the abdomen reveals it to be soft and nontender with normal bowel sounds.  There is no inguinal adenopathy appreciated.  On pelvic exam, the external genitalia are unremarkable.  A speculum exam is performed. The vaginal cuff is well-healed.  There are no mucosal lesions noted.  A Pap smear is obtained of the proximal vagina.  On bimanual and rectovaginal examination there are no pelvic masses appreciated.  IMPRESSION/PLAN:  Clinically no evidence of disease, Pap smear pending. The patient will return for routine followup in 8 months and in the interim will see Dr. Duard Winters at 4 month interval.    ______________________________ [name], Ph.D., M.D. JDK/MEDQ  D:  01/15/2012  T:  01/15/2012  Job:  [id number]

## 2012-01-16 ENCOUNTER — Other Ambulatory Visit (HOSPITAL_COMMUNITY)
Admission: RE | Admit: 2012-01-16 | Discharge: 2012-01-16 | Disposition: A | Discharge status: Home / Self Care | Payer: BC Managed Care – PPO | Source: Ambulatory Visit | Attending: Radiation Oncology | Admitting: Radiation Oncology

## 2012-01-22 ENCOUNTER — Telehealth: Payer: Self-pay

## 2012-01-22 NOTE — Telephone Encounter (Signed)
Voice mail left to inform pap smear negative for intraepithelial cells/malignancy.number left to return call if needed.

## 2012-03-18 ENCOUNTER — Ambulatory Visit (INDEPENDENT_AMBULATORY_CARE_PROVIDER_SITE_OTHER): Payer: BC Managed Care – PPO | Admitting: Ophthalmology

## 2012-03-18 DIAGNOSIS — H35419 Lattice degeneration of retina, unspecified eye: Secondary | ICD-10-CM

## 2012-03-18 DIAGNOSIS — H33309 Unspecified retinal break, unspecified eye: Secondary | ICD-10-CM

## 2012-03-18 DIAGNOSIS — H2510 Age-related nuclear cataract, unspecified eye: Secondary | ICD-10-CM

## 2012-03-18 DIAGNOSIS — H43819 Vitreous degeneration, unspecified eye: Secondary | ICD-10-CM

## 2012-03-21 ENCOUNTER — Encounter (INDEPENDENT_AMBULATORY_CARE_PROVIDER_SITE_OTHER): Payer: BC Managed Care – PPO | Admitting: Ophthalmology

## 2012-03-21 DIAGNOSIS — H33309 Unspecified retinal break, unspecified eye: Secondary | ICD-10-CM

## 2012-03-28 ENCOUNTER — Ambulatory Visit (INDEPENDENT_AMBULATORY_CARE_PROVIDER_SITE_OTHER): Payer: BC Managed Care – PPO | Admitting: Ophthalmology

## 2012-03-28 DIAGNOSIS — H33309 Unspecified retinal break, unspecified eye: Secondary | ICD-10-CM

## 2012-04-17 ENCOUNTER — Ambulatory Visit: Payer: BC Managed Care – PPO | Admitting: Gynecologic Oncology

## 2012-05-02 ENCOUNTER — Ambulatory Visit: Payer: BC Managed Care – PPO | Attending: Gynecologic Oncology | Admitting: Gynecologic Oncology

## 2012-05-02 ENCOUNTER — Encounter: Payer: Self-pay | Admitting: Gynecologic Oncology

## 2012-05-02 ENCOUNTER — Other Ambulatory Visit (HOSPITAL_COMMUNITY)
Admission: RE | Admit: 2012-05-02 | Discharge: 2012-05-02 | Disposition: A | Discharge status: Home / Self Care | Payer: BC Managed Care – PPO | Source: Ambulatory Visit | Attending: Gynecologic Oncology | Admitting: Gynecologic Oncology

## 2012-05-02 VITALS — BP 142/80 | HR 78 | Temp 98.2°F | Resp 18 | Ht 69.5 in | Wt 231.3 lb

## 2012-05-02 DIAGNOSIS — C549 Malignant neoplasm of corpus uteri, unspecified: Secondary | ICD-10-CM | POA: Insufficient documentation

## 2012-05-02 DIAGNOSIS — Z9221 Personal history of antineoplastic chemotherapy: Secondary | ICD-10-CM | POA: Insufficient documentation

## 2012-05-02 DIAGNOSIS — C541 Malignant neoplasm of endometrium: Secondary | ICD-10-CM

## 2012-05-02 DIAGNOSIS — Z90710 Acquired absence of both cervix and uterus: Secondary | ICD-10-CM | POA: Insufficient documentation

## 2012-05-02 DIAGNOSIS — Z87891 Personal history of nicotine dependence: Secondary | ICD-10-CM | POA: Insufficient documentation

## 2012-05-02 DIAGNOSIS — Z124 Encounter for screening for malignant neoplasm of cervix: Secondary | ICD-10-CM | POA: Insufficient documentation

## 2012-05-02 DIAGNOSIS — Z800 Family history of malignant neoplasm of digestive organs: Secondary | ICD-10-CM | POA: Insufficient documentation

## 2012-05-02 DIAGNOSIS — Z841 Family history of disorders of kidney and ureter: Secondary | ICD-10-CM | POA: Insufficient documentation

## 2012-05-02 DIAGNOSIS — H3320 Serous retinal detachment, unspecified eye: Secondary | ICD-10-CM | POA: Insufficient documentation

## 2012-05-02 NOTE — Patient Instructions (Signed)
RTC in 8 months

## 2012-05-02 NOTE — Progress Notes (Signed)
Consult Note: Gyn-Onc  [name] 61 y.o. female  CC:  Chief Complaint  Patient presents with  . Endo ca    Follow up    HPI: The patient is very pleasant 61 year old who is referred by Dr. Gevena Cotton. She had a 67-month history of  postmenopausal bleeding that was bright red. She underwent an endometrial biopsy that revealed at least complex hyperplasia with atypia. Subsequently on May 09, 2011, she underwent robotic-assisted hysterectomy, bilateral salpingo-oophorectomy. Frozen section with a grade 1 endometrioid adenocarcinoma with at least if not more than 50% myometrial invasion. Final pathology revealed a grade 2 endometrioid adenocarcinoma with deep myometrial invasion of 0.6 to 1.1 cm for a total of approximately 55%. There was lymphovascular space involvement with a grade 2 lesion. 0 out of 11 lymph nodes were involved, 0 out of 1 left pelvic, 0 out of 4 right pelvic, 0 out of 5 right periaortic and 0 out of 1 left periaortic nodes being involved. Adnexa were negative. She is therefore stage IB grade 2 endometrioid adenocarcinoma. We reviewed the GOG data and at her age, she would require three risk factor, she has two including lymphovascular space involvement and grade 2 disease but she does not have sufficient myometrial invasion, though the cut off on the GOG was 66% and she has 55%. After we discussed that, she would at least want to speak with radiation-oncology and she ultimately decided to proceed with vaginal cuff brachytherapy.   Interval History: She tolerated her brachytherapy well. She had quite a bit of fatigue from that. She has noticed that over the past few weeks her energy level has improved. She said that she is essentially just been eating and sleeping for the last 6 months. Her weight has gone up 10 pounds since we saw her in December. She will he admits to eating quite poorly for the past 6 months. She states that now that her energy level is improving she is really  going to make a concerted effort to start exercising and to watch her diet. She has taken some Claritin for some allergy symptoms. She saw Dr. Roselind Messier in 3/13 and had a normal exam at that time.   Review of Systems: 10 point review of systems is negative.  She denies any nausea, vomiting, fevers, chills. She has a change in her bowel or bladder habits. She denies any vaginal bleeding. She has been using her vaginal dilator. She has gained about 10 pounds since we saw her in December as I mentioned above. She states that really she's been quite sedentary over the winter and looks forward to getting more exercise and eating better in the summer. There's been no changes in her family history. Her brother started hemodialysis for renal failure. It is felt that his renal failure is due to trauma experienced playing football in college   Current Meds:  Outpatient Encounter Prescriptions as of 05/02/2012  Medication Sig Dispense Refill  . Loratadine (CLARITIN PO) Take by mouth as needed.         Allergy: No Known Allergies  Social Hx:   History   Social History  . Marital Status: Single    Spouse Name: N/A    Number of Children: N/A  . Years of Education: N/A   Occupational History  . Not on file.   Social History Main Topics  . Smoking status: Former Smoker -- 0.5 packs/day for 30 years    Types: Cigarettes    Quit date: 01/12/2011  . Smokeless  tobacco: Not on file  . Alcohol Use: Yes     rarely  . Drug Use: No  . Sexually Active: No   Other Topics Concern  . Not on file   Social History Narrative  . No narrative on file    Past Surgical Hx:  Past Surgical History  Procedure Date  . Wisdom tooth extraction 1973  . Abdominal hysterectomy 05/09/11    Robotic TAH, BSO, and LND    Past Medical Hx:  Past Medical History  Diagnosis Date  . Endometrial adenocarcinoma     Stage IB grade 2  . History of brachytherapy Aug. 8, 14, 17, 29 Sept. 4    Intracavitary/Iridium 192  (3000  cGy)  . Blindness of left eye     partial blindness  . Retinal detachment     Family Hx:  Family History  Problem Relation Age of Onset  . Colon cancer Mother   . Aneurysm Father   . Colon cancer Brother   . Kidney failure Other     Vitals:  Temperature 98.2 F (36.8 C), temperature source Oral, height 5' 9.5" (1.765 m), weight 231 lb 4.8 oz (104.917 kg).  Physical Exam:   Well-nourished well-developed female in no acute distress.  Neck: No lymphadenopathy no thyromegaly.  Lungs: Clear to auscultation bilaterally.  Cardiovascular exam: Regular rate and rhythm.  Abdomen: Well-healed surgical incisions. Abdomen is soft nontender nondistended there are no palpable masses or hepatosplenomegaly.  Groins: No lymphadenopathy.  Pelvic: External genitalia within normal limits. The vagina is atrophic. The vaginal cuff is without lesions. ThinPrep Pap smear was admitted. Bimanual examination reveals no masses or nodularity. Rectal confirms.  Extremities: No edema   Assessment/Plan:  61 year old with a stage IB grade 2 endometrioid adenocarcinoma and is doing well after undergoing surgery and vaginal cuff brachytherapy. Plan: We'll followup on the results of her Pap smear from today. She will see Dr. Roselind Messier in 4 months and return to see me in 8 months.    Shalaine Payson A., MD 05/02/2012, 10:14 AM

## 2012-05-02 NOTE — Addendum Note (Signed)
Addended by: Randel Pigg on: 05/02/2012 10:33 AM   Modules accepted: Orders

## 2012-05-06 ENCOUNTER — Telehealth: Payer: Self-pay | Admitting: Gynecologic Oncology

## 2012-05-06 NOTE — Telephone Encounter (Signed)
Pt notified about pap results: negative.  No questions or concerns voiced. 

## 2012-06-18 IMAGING — MG MM DIGITAL DIAGNOSTIC BILAT LTD {BCG}
4 series · 4 of 4 positions shown · non-contrast
Comparison: None.

CLINICAL DATA: The patient returns for evaluation of a possible
masses in the medial aspect of each breast noted on recent
screening study dated 03/09/2011.

DIGITAL DIAGNOSTIC BILATERAL LIMITED MAMMOGRAM  AND BILATERAL
BREAST ULTRASOUND:

[L CC]
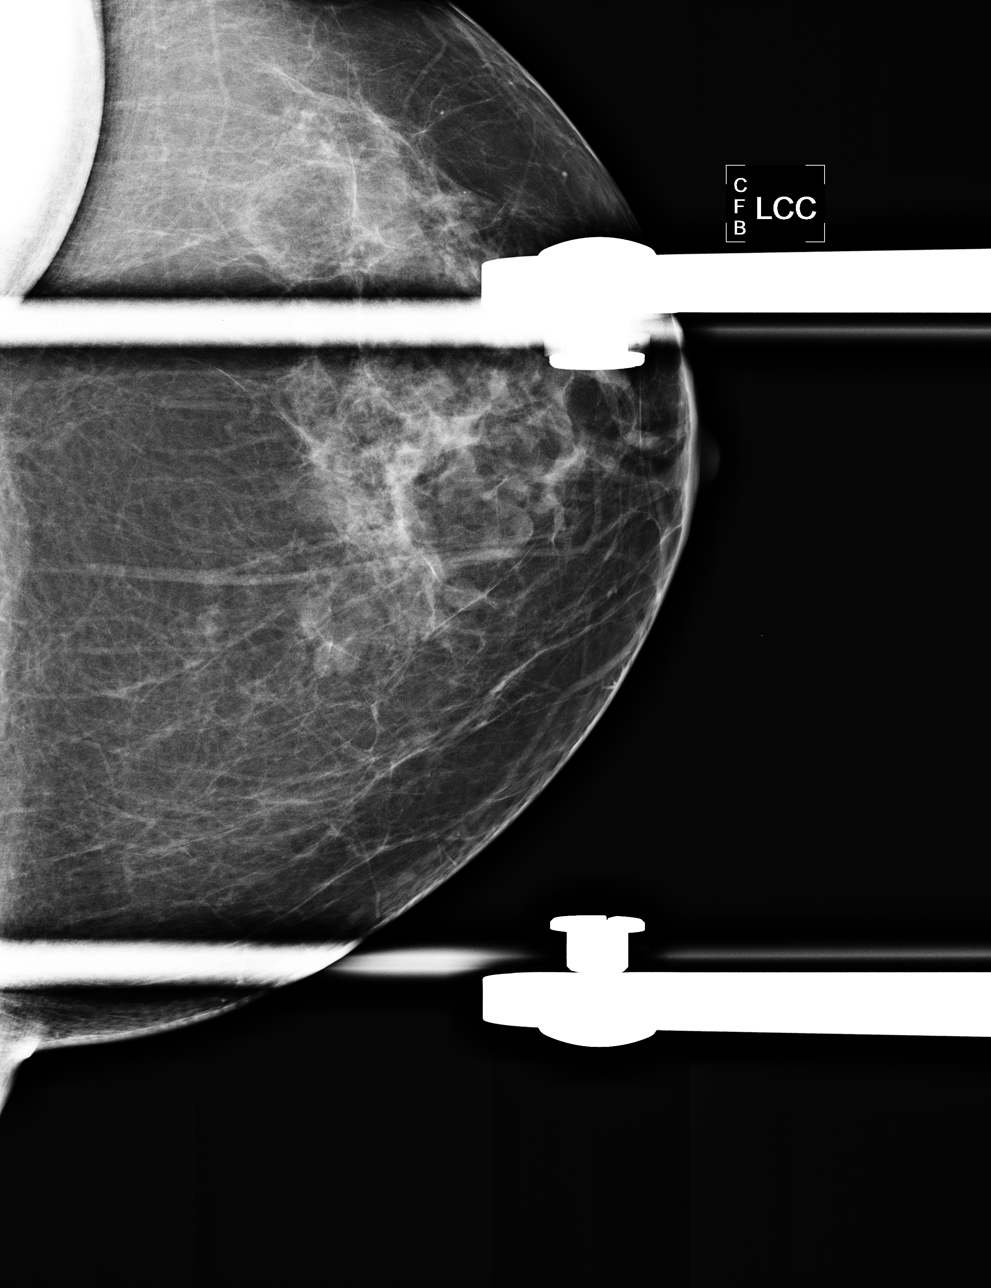

[L MLO]
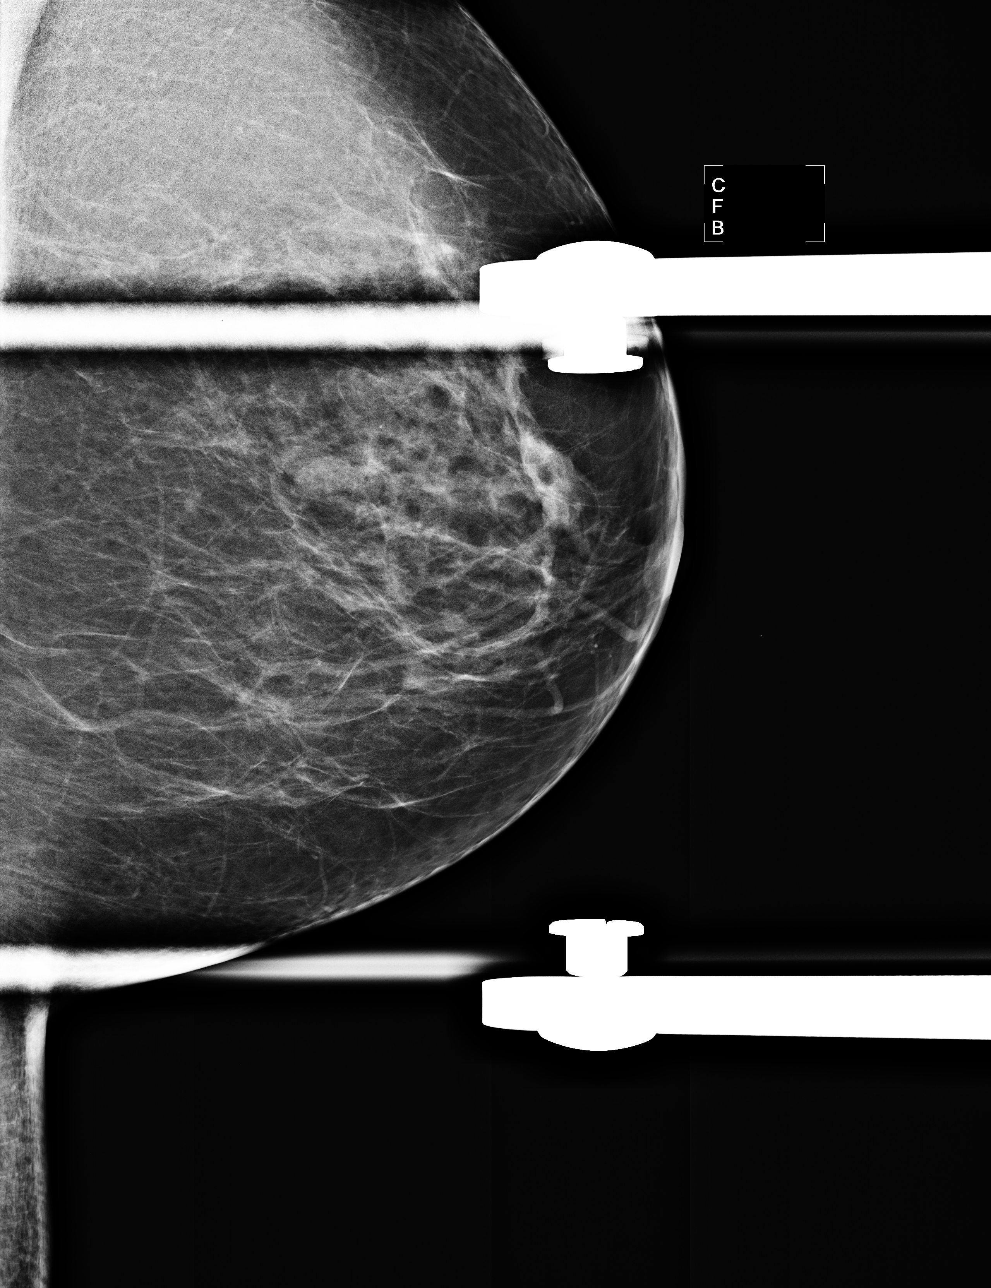

[R CC]
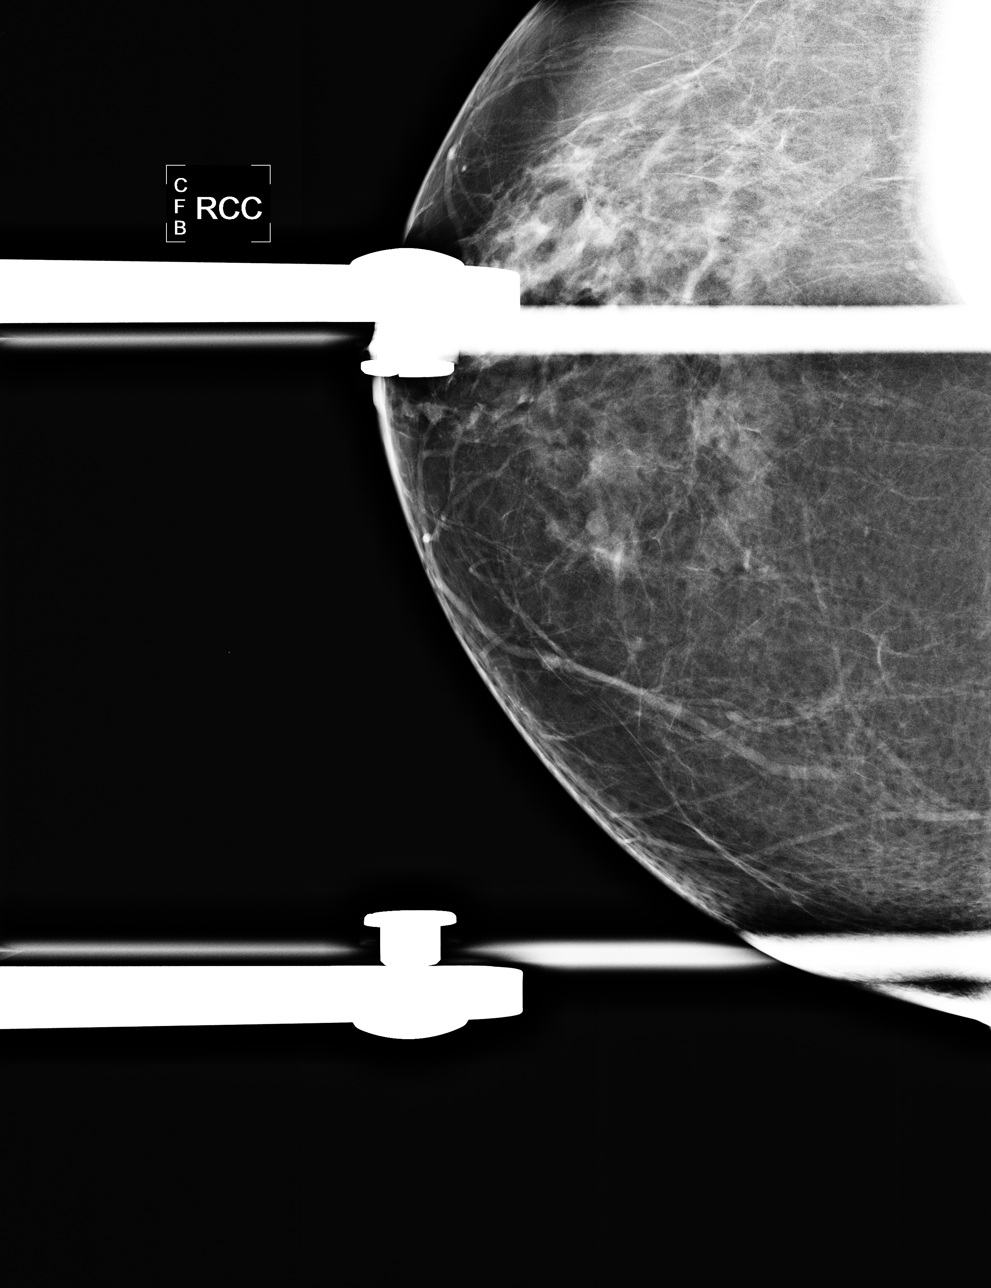

[R MLO]
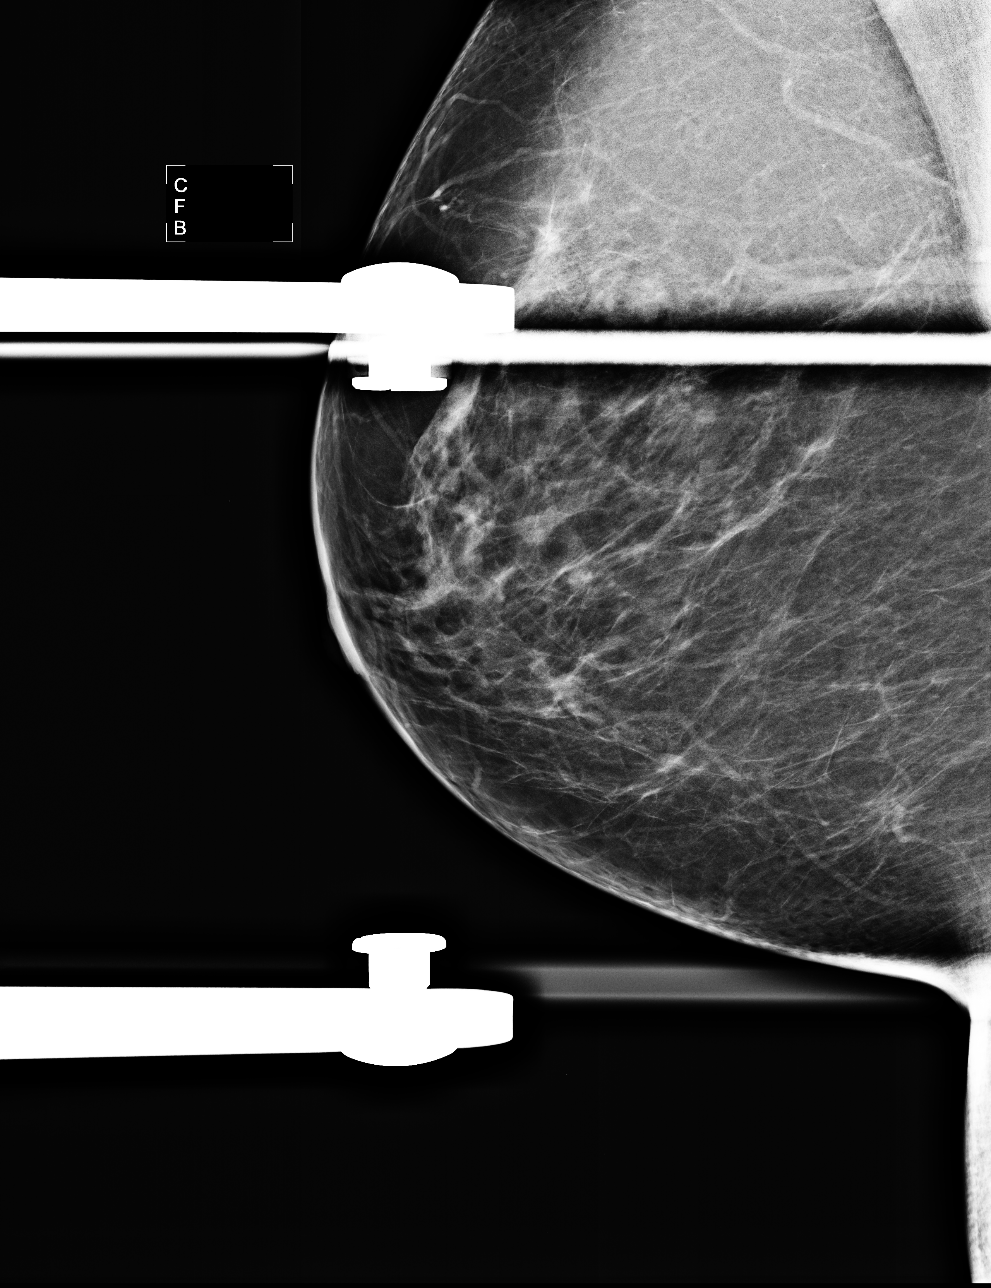

[4 of 4 positions shown; findings below may reference images not displayed]

FINDINGS: Additional views confirm the presence of small
circumscribed round nodules in the medial aspect of each breast.

On physical exam, no mass is palpated in either breast.

Ultrasound is performed, showing multiple subcentimeter cysts in
the medial aspect of each breast with no solid mass, distortion or
shadowing to suggest malignancy.
IMPRESSION: No mammographic or sonographic evidence of malignancy.  Yearly
screening mammography is suggested.

BI-RADS CATEGORY 2:  Benign finding(s).

## 2012-06-18 IMAGING — US US BREAST BILATERAL
1 series · 8 of 8 positions shown · non-contrast
Comparison: None.

CLINICAL DATA: The patient returns for evaluation of a possible
masses in the medial aspect of each breast noted on recent
screening study dated 03/09/2011.

DIGITAL DIAGNOSTIC BILATERAL LIMITED MAMMOGRAM  AND BILATERAL
BREAST ULTRASOUND:

[Series 1: us breast bilateral · 8 of 8 slices shown]
[im 1/8]
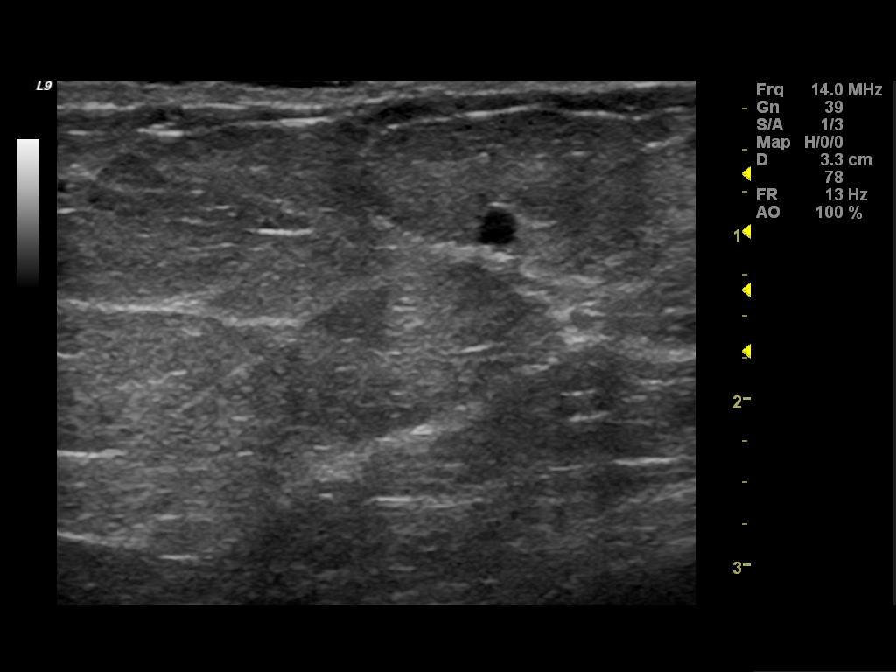
[im 2/8]
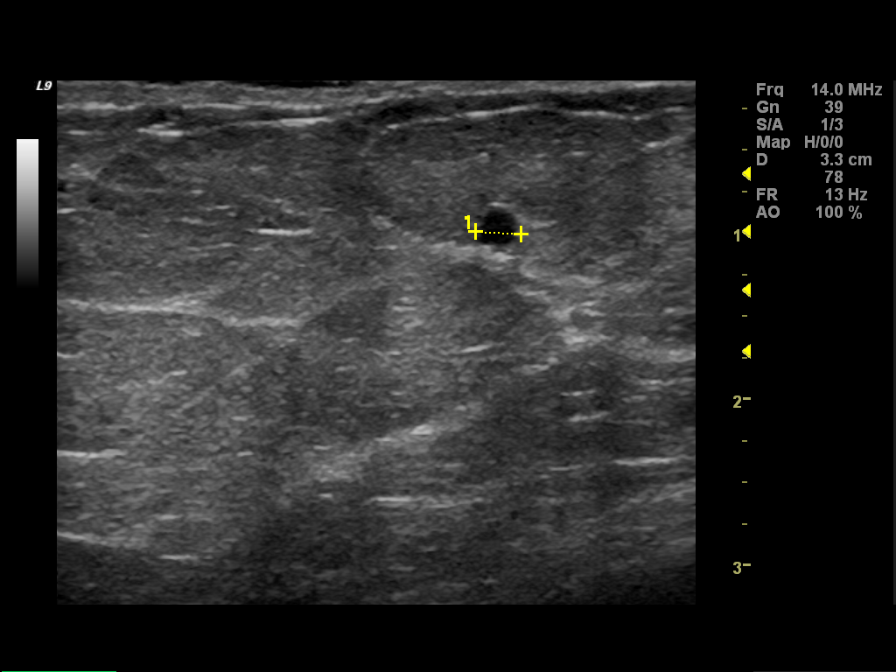
[im 3/8]
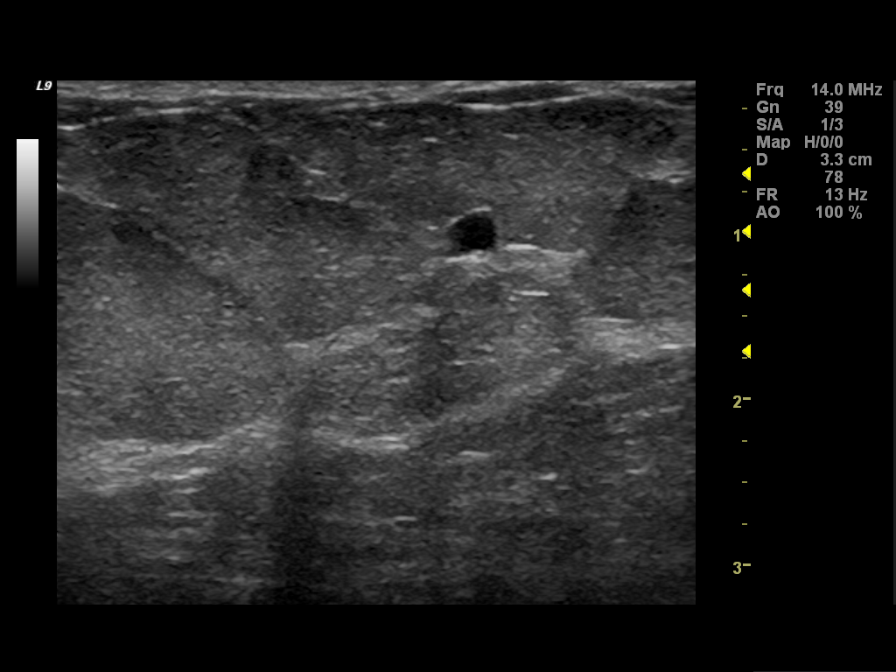
[im 4/8]
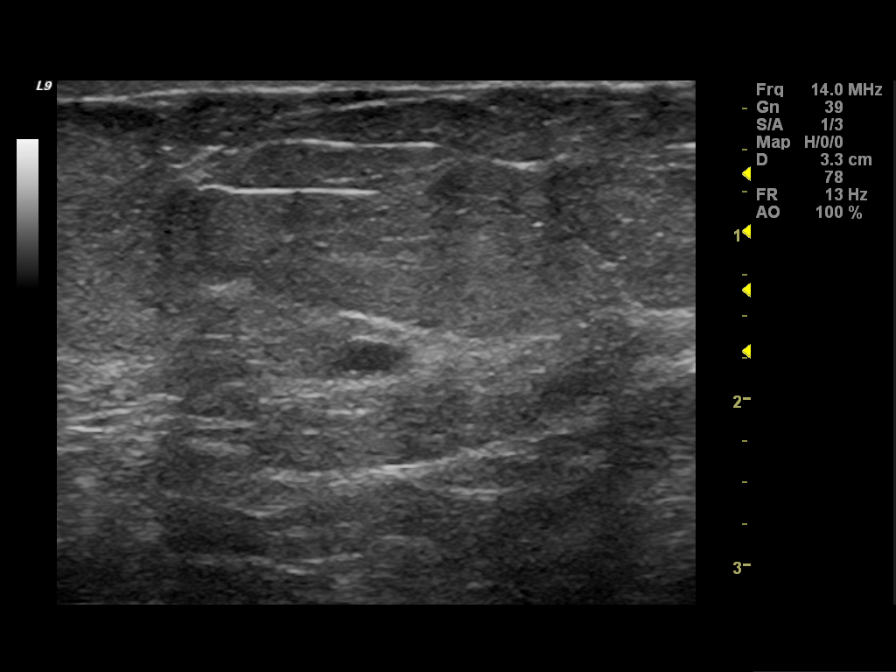
[im 5/8]
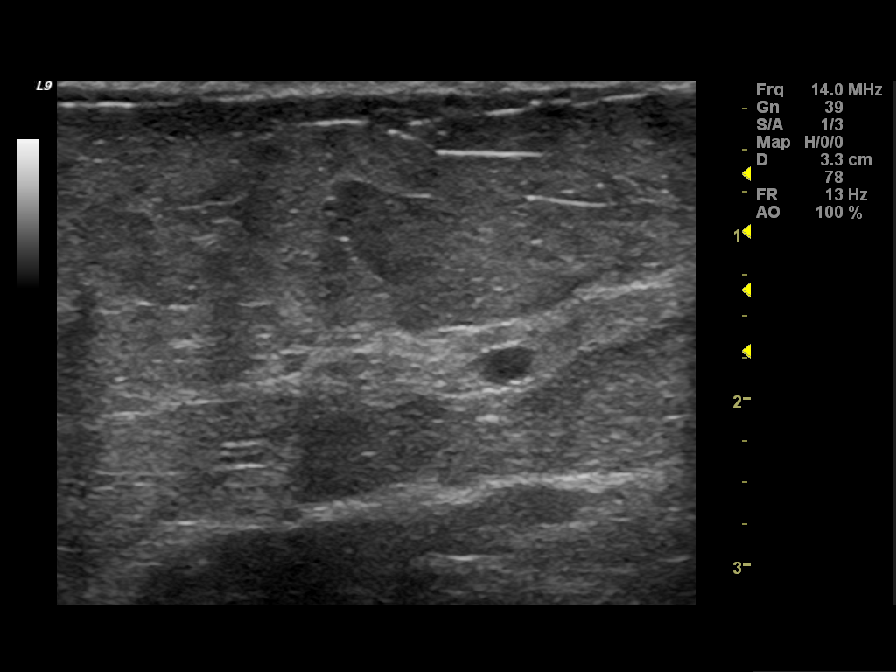
[im 6/8]
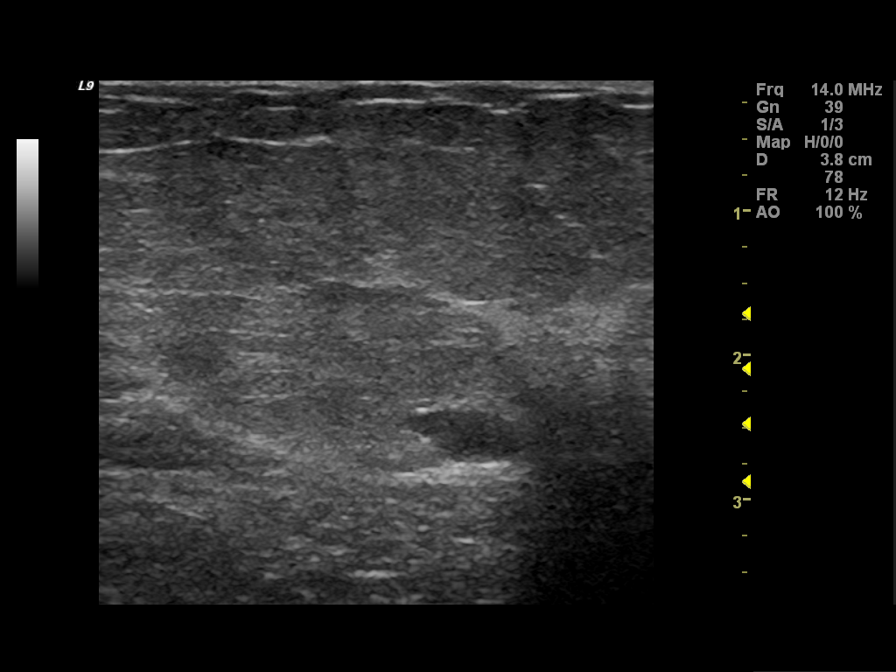
[im 7/8]
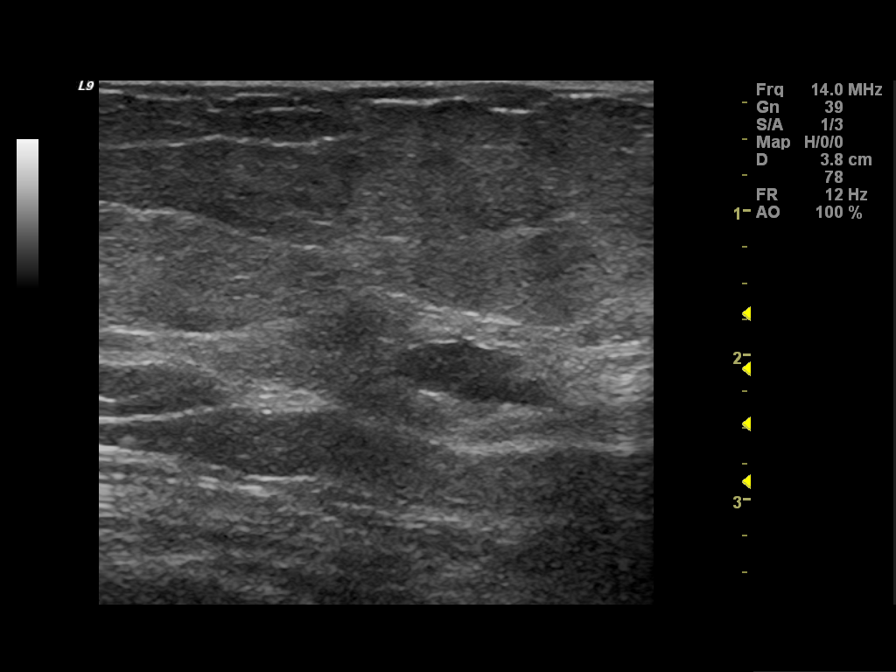
[im 8/8]
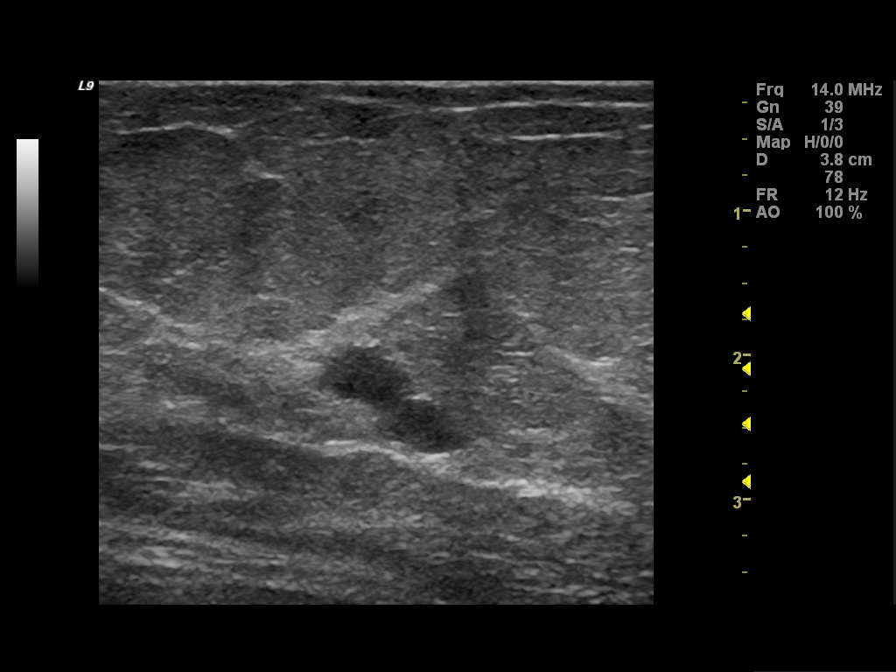

[8 of 8 positions shown; findings below may reference images not displayed]

FINDINGS: Additional views confirm the presence of small
circumscribed round nodules in the medial aspect of each breast.

On physical exam, no mass is palpated in either breast.

Ultrasound is performed, showing multiple subcentimeter cysts in
the medial aspect of each breast with no solid mass, distortion or
shadowing to suggest malignancy.
IMPRESSION: No mammographic or sonographic evidence of malignancy.  Yearly
screening mammography is suggested.

BI-RADS CATEGORY 2:  Benign finding(s).

## 2012-07-29 ENCOUNTER — Ambulatory Visit (INDEPENDENT_AMBULATORY_CARE_PROVIDER_SITE_OTHER): Payer: BC Managed Care – PPO | Admitting: Ophthalmology

## 2012-08-30 ENCOUNTER — Encounter: Payer: Self-pay | Admitting: Radiation Oncology

## 2012-09-02 ENCOUNTER — Ambulatory Visit
Admission: RE | Admit: 2012-09-02 | Discharge: 2012-09-02 | Disposition: A | Discharge status: Home / Self Care | Payer: BC Managed Care – PPO | Source: Ambulatory Visit | Attending: Radiation Oncology | Admitting: Radiation Oncology

## 2012-09-02 ENCOUNTER — Encounter: Payer: Self-pay | Admitting: Radiation Oncology

## 2012-09-02 ENCOUNTER — Other Ambulatory Visit (HOSPITAL_COMMUNITY)
Admission: RE | Admit: 2012-09-02 | Discharge: 2012-09-02 | Disposition: A | Discharge status: Home / Self Care | Payer: BC Managed Care – PPO | Source: Ambulatory Visit | Attending: Radiation Oncology | Admitting: Radiation Oncology

## 2012-09-02 VITALS — BP 183/92 | HR 86 | Temp 97.5°F | Resp 18 | Wt 231.9 lb

## 2012-09-02 DIAGNOSIS — C541 Malignant neoplasm of endometrium: Secondary | ICD-10-CM

## 2012-09-02 DIAGNOSIS — Z01419 Encounter for gynecological examination (general) (routine) without abnormal findings: Secondary | ICD-10-CM | POA: Insufficient documentation

## 2012-09-02 NOTE — Patient Instructions (Signed)
Routine followup in gynecologic oncology in 4 months  Routine followup in radiation oncology in 8 months

## 2012-09-02 NOTE — Progress Notes (Signed)
  Radiation Oncology         [phone]) [phone] ________________________________  Name: [name] MRN: [id number]  Date: 09/02/2012  DOB: 1951/02/23  Follow-Up Visit Note  CC: No primary provider on file.  Thompson Caul., MD  Diagnosis:   Stage IB grade 2 endometrial adenocarcinoma  Interval Since Last Radiation:  13 months  Narrative:  The patient returns today for routine follow-up.  She is doing well at this time. She denies any pelvic pain, vaginal bleeding, hematuria or rectal bleeding. She continues to use her vaginal dilator although somewhat intermittently. patient's blood pressure is elevated today after losing her family pet yesterday unexpectedly.                              ALLERGIES:   has no known allergies.  Meds: Current Outpatient Prescriptions  Medication Sig Dispense Refill  . Loratadine (CLARITIN PO) Take by mouth as needed.         Physical Findings: The patient is in no acute distress. Patient is alert and oriented.  weight is 231 lb 14.4 oz (105.189 kg). Her oral temperature is 97.5 F (36.4 C). Her blood pressure is 184/112 and her pulse is 84. Her respiration is 18. Marland Kitchen  No palpable cervical supraclavicular or axillary adenopathy. Lungs are clear to auscultation. The heart has a regular rhythm and rate. The abdomen is soft and nontender with normal bowel sounds. There is no inguinal adenopathy. On pelvic examination the external genitalia are unremarkable. A speculum exam is performed. There are no mucosal lesions noted. The mucosa is somewhat atrophic and dry.  A Pap smear was obtained of the proximal vagina. On bimanual and rectovaginal examination there are no pelvic masses appreciated.  Lab Findings: Lab Results  Component Value Date   WBC 14.2* 05/10/2011   HGB 13.2 05/10/2011   HCT 40.5 05/10/2011   MCV 88.8 05/10/2011   PLT 198 05/10/2011    @LASTCHEM @  Radiographic Findings: No results found.  Impression:  No evidence of recurrence on  clinical exam today, Pap smear pending.  Plan:  Routine followup in 8 months. In the interim the patient will be seen in gynecologic oncology.  _____________________________________    Billie Lade, PhD, MD

## 2012-09-02 NOTE — Progress Notes (Signed)
Patient presents to the clinic today unaccompanied for a follow up appointment with Dr. Roselind Messier. Patient alert and oriented to person, place, and time. No distress noted. Steady gait noted. Pleasant affect noted. Patient denies pain at this time. Blood pressure elevated. Patient reports that her "blood pressure always spike when she goes to the doctors." patient denies headache, dizziness, nausea, or vomiting. Patient denies vaginal bleeding or discharge. Patient denies diarrhea. Patient reports her energy level has returned to normal. Patient denies difficulty eating or sleeping. Reported all findings to Dr. Roselind Messier.     Last pap smear 05/02/2012

## 2012-09-02 NOTE — Addendum Note (Signed)
Encounter addended by: Tessa Lerner, RN on: 09/02/2012  8:38 AM<BR>     Documentation filed: Inpatient Document Flowsheet

## 2012-09-06 ENCOUNTER — Telehealth: Payer: Self-pay

## 2012-09-06 NOTE — Telephone Encounter (Signed)
Called to inform patient of normal pap smear performed on 09/02/2012.No answer, left voice message to return my call.

## 2012-09-06 NOTE — Telephone Encounter (Signed)
Patient returned call to get results of pap smear performed on 09/02/12.Marland Kitcheninformed patient result is normal, negative for intraepithelial cell

## 2013-01-01 ENCOUNTER — Ambulatory Visit: Payer: BC Managed Care – PPO | Admitting: Gynecologic Oncology

## 2013-01-07 ENCOUNTER — Encounter: Payer: Self-pay | Admitting: Gynecologic Oncology

## 2013-01-07 ENCOUNTER — Other Ambulatory Visit (HOSPITAL_COMMUNITY)
Admission: RE | Admit: 2013-01-07 | Discharge: 2013-01-07 | Disposition: A | Discharge status: Home / Self Care | Payer: BC Managed Care – PPO | Source: Ambulatory Visit | Attending: Gynecologic Oncology | Admitting: Gynecologic Oncology

## 2013-01-07 ENCOUNTER — Ambulatory Visit: Payer: BC Managed Care – PPO | Attending: Gynecologic Oncology | Admitting: Gynecologic Oncology

## 2013-01-07 ENCOUNTER — Other Ambulatory Visit: Payer: Self-pay | Admitting: Family Medicine

## 2013-01-07 VITALS — BP 140/90 | HR 80 | Temp 97.6°F | Resp 18 | Ht 69.5 in | Wt 232.0 lb

## 2013-01-07 DIAGNOSIS — Z90710 Acquired absence of both cervix and uterus: Secondary | ICD-10-CM | POA: Insufficient documentation

## 2013-01-07 DIAGNOSIS — Z9079 Acquired absence of other genital organ(s): Secondary | ICD-10-CM | POA: Insufficient documentation

## 2013-01-07 DIAGNOSIS — Z01419 Encounter for gynecological examination (general) (routine) without abnormal findings: Secondary | ICD-10-CM | POA: Insufficient documentation

## 2013-01-07 DIAGNOSIS — C541 Malignant neoplasm of endometrium: Secondary | ICD-10-CM

## 2013-01-07 DIAGNOSIS — Z09 Encounter for follow-up examination after completed treatment for conditions other than malignant neoplasm: Secondary | ICD-10-CM | POA: Insufficient documentation

## 2013-01-07 DIAGNOSIS — C549 Malignant neoplasm of corpus uteri, unspecified: Secondary | ICD-10-CM | POA: Insufficient documentation

## 2013-01-07 DIAGNOSIS — Z1231 Encounter for screening mammogram for malignant neoplasm of breast: Secondary | ICD-10-CM

## 2013-01-07 DIAGNOSIS — Z800 Family history of malignant neoplasm of digestive organs: Secondary | ICD-10-CM | POA: Insufficient documentation

## 2013-01-07 NOTE — Patient Instructions (Signed)
RTC to see Dr. Roselind Messier in 3-4 months and return to see gynecologic oncology 3-4 months after that visit.

## 2013-01-07 NOTE — Progress Notes (Signed)
Consult Note: Gyn-Onc  [name] 62 y.o. female  CC:  Chief Complaint  Patient presents with  . Endometrial cancer    Follow up    HPI: The patient is very pleasant 62 year old who was referred by Dr. Gevena Cotton.   She had a 2-month history of postmenopausal bleeding that was bright red. She underwent an endometrial biopsy that revealed at least complex hyperplasia with atypia. Subsequently on May 09, 2011, she underwent robotic-assisted hysterectomy, bilateral salpingo-oophorectomy. Frozen section with a grade 1 endometrioid adenocarcinoma with at least if not more than 50% myometrial invasion. Final pathology revealed a grade 2 endometrioid adenocarcinoma with deep myometrial invasion of 0.6 to 1.1 cm for a total of approximately 55%. There was lymphovascular space involvement with a grade 2 lesion. 0 out of 11 lymph nodes were involved, 0 out of 1 left pelvic, 0 out of 4 right pelvic, 0 out of 5 right periaortic and 0 out of 1 left periaortic nodes being involved. Adnexa were negative. She is therefore stage IB grade 2 endometrioid adenocarcinoma. We reviewed the GOG data and at her age, she would require three risk factor, she has two including lymphovascular space involvement and grade 2 disease but she does not have sufficient myometrial invasion, though the cut off on the GOG was 66% and she has 55%. After we discussed that, she would at least want to speak with radiation-oncology and she ultimately decided to proceed with vaginal cuff brachytherapy.   Interval History: She tolerated her brachytherapy well. She had quite a bit of fatigue from that. She states that now that her energy level is improving she is really going to make a concerted effort to start exercising and to watch her diet. She has taken some Claritin for some allergy symptoms. She saw Dr. Roselind Messier in 3/13 and had a normal exam at that time. I last saw her 6/13 at which time her exam was negative and she saw Dr. Roselind Messier in  10/13.  She never had her MMG last year, she is due for a colonoscopy. She has not seen her primary MD in several years.  Review of Systems: 10 point review of systems is negative. She denies any nausea, vomiting, fevers, chills. She has a change in her bowel or bladder habits. She denies any vaginal bleeding. She states that really she's been quite sedentary over the winter and looks forward to getting more exercise and eating better in the summer. There's been no changes in her family history.   Current Meds:  Outpatient Encounter Prescriptions as of 01/07/2013  Medication Sig Dispense Refill  . Loratadine (CLARITIN PO) Take by mouth as needed.        No facility-administered encounter medications on file as of 01/07/2013.    Allergy: No Known Allergies  Social Hx:   History   Social History  . Marital Status: Single    Spouse Name: N/A    Number of Children: N/A  . Years of Education: N/A   Occupational History  . Not on file.   Social History Main Topics  . Smoking status: Former Smoker -- 0.50 packs/day for 30 years    Types: Cigarettes    Quit date: 01/12/2011  . Smokeless tobacco: Not on file  . Alcohol Use: Yes     Comment: rarely  . Drug Use: No  . Sexually Active: No   Other Topics Concern  . Not on file   Social History Narrative  . No narrative on file  Past Surgical Hx:  Past Surgical History  Procedure Laterality Date  . Wisdom tooth extraction  1973  . Abdominal hysterectomy  05/09/11    Robotic TAH, BSO, and LND    Past Medical Hx:  Past Medical History  Diagnosis Date  . Endometrial adenocarcinoma     Stage IB grade 2  . History of brachytherapy Aug. 8, 14, 17, 29 Sept. 4    Intracavitary/Iridium 192  (3000 cGy)  . Blindness of left eye     partial blindness  . Retinal detachment   . History of radiation therapy 06/20/13/17/29/13&07/17/12    HDR endometrial asdenocarcinoma    Family Hx:  Family History  Problem Relation Age of Onset   . Colon cancer Mother   . Aneurysm Father   . Colon cancer Brother   . Kidney failure Other     Vitals:  Blood pressure 140/90, pulse 80, temperature 97.6 F (36.4 C), temperature source Oral, resp. rate 18, height 5' 9.5" (1.765 m), weight 232 lb (105.235 kg).  Physical Exam:  Well-nourished well-developed female in no acute distress.   Neck: No lymphadenopathy no thyromegaly.   Lungs: Clear to auscultation bilaterally.   Cardiovascular exam: Regular rate and rhythm.   Abdomen: Well-healed surgical incisions. Abdomen is soft nontender nondistended there are no palpable masses or hepatosplenomegaly.   Groins: No lymphadenopathy.   Pelvic: External genitalia within normal limits. The vagina is atrophic. The vaginal cuff is without lesions. ThinPrep Pap smear was admitted. Bimanual examination reveals no masses or nodularity. Rectal confirms.   Extremities: No edema   Assessment/Plan:  62 year old with a stage IB grade 2 endometrioid adenocarcinoma and is doing well after undergoing surgery and vaginal cuff brachytherapy. Plan: We'll followup on the results of her Pap smear from today. She will see Dr. Roselind Messier in 4 months and return to see me in 8 months. She will be responsible for scheduling her mammogram and colonoscopy.    Moriyah Byington A., MD 01/07/2013, 11:29 AM

## 2013-01-15 ENCOUNTER — Telehealth: Payer: Self-pay | Admitting: *Deleted

## 2013-01-15 NOTE — Telephone Encounter (Signed)
Message left for pt with PAP results  

## 2013-02-05 ENCOUNTER — Ambulatory Visit
Admission: RE | Admit: 2013-02-05 | Discharge: 2013-02-05 | Disposition: A | Discharge status: Home / Self Care | Payer: BC Managed Care – PPO | Source: Ambulatory Visit | Attending: Family Medicine | Admitting: Family Medicine

## 2013-02-05 DIAGNOSIS — Z1231 Encounter for screening mammogram for malignant neoplasm of breast: Secondary | ICD-10-CM

## 2013-04-28 ENCOUNTER — Encounter: Payer: Self-pay | Admitting: Radiation Oncology

## 2013-04-28 DIAGNOSIS — Z923 Personal history of irradiation: Secondary | ICD-10-CM | POA: Insufficient documentation

## 2013-04-28 DIAGNOSIS — C541 Malignant neoplasm of endometrium: Secondary | ICD-10-CM | POA: Insufficient documentation

## 2013-05-01 ENCOUNTER — Other Ambulatory Visit: Payer: Self-pay

## 2013-05-01 DIAGNOSIS — Z1231 Encounter for screening mammogram for malignant neoplasm of breast: Secondary | ICD-10-CM

## 2013-05-05 ENCOUNTER — Encounter: Payer: Self-pay | Admitting: Radiation Oncology

## 2013-05-05 ENCOUNTER — Other Ambulatory Visit (HOSPITAL_COMMUNITY)
Admission: RE | Admit: 2013-05-05 | Discharge: 2013-05-05 | Disposition: A | Discharge status: Home / Self Care | Payer: BC Managed Care – PPO | Source: Ambulatory Visit | Attending: Radiation Oncology | Admitting: Radiation Oncology

## 2013-05-05 ENCOUNTER — Ambulatory Visit
Admission: RE | Admit: 2013-05-05 | Discharge: 2013-05-05 | Disposition: A | Discharge status: Home / Self Care | Payer: BC Managed Care – PPO | Source: Ambulatory Visit | Attending: Radiation Oncology | Admitting: Radiation Oncology

## 2013-05-05 VITALS — BP 171/113 | HR 90 | Temp 97.8°F | Resp 20 | Wt 235.0 lb

## 2013-05-05 DIAGNOSIS — C541 Malignant neoplasm of endometrium: Secondary | ICD-10-CM

## 2013-05-05 DIAGNOSIS — Z01419 Encounter for gynecological examination (general) (routine) without abnormal findings: Secondary | ICD-10-CM | POA: Insufficient documentation

## 2013-05-05 NOTE — Progress Notes (Signed)
  Radiation Oncology         [phone]) [phone] ________________________________  Name: [name] MRN: [id number]  Date: 05/05/2013  DOB: 03-07-1951  Follow-Up Visit Note  CC: No primary provider on file.  Thompson Caul., MD  Diagnosis:   Stage IB grade 2 endometrioid adenocarcinoma   Narrative:  The patient returns today for routine follow-up.  She completed intracavitary brachytherapy treatments one year and 9 months ago. Clinically she seems to be doing well this time. She continues to use her vaginal dilator. She denies any pelvic pain,  vaginal bleeding,  urination symptoms or bowel complaints. She continues to followup with gynecologic oncology.                      ALLERGIES:  has No Known Allergies.  Meds: Current Outpatient Prescriptions  Medication Sig Dispense Refill  . Loratadine (CLARITIN PO) Take by mouth as needed.        No current facility-administered medications for this encounter.    Physical Findings: The patient is in no acute distress. Patient is alert and oriented.  weight is 235 lb (106.595 kg). Her oral temperature is 97.8 F (36.6 C). Her blood pressure is 171/113 and her pulse is 90. Her respiration is 20. Marland Kitchen No palpable supraclavicular or axillary adenopathy. The lungs are clear to auscultation. The heart has a regular rhythm and rate. The abdomen is soft and nontender with normal bowel sounds. The inguinal areas are free of adenopathy. On pelvic examination the external genitalia are unremarkable. A speculum exam is performed. A small speculum is used for the exam. There are no mucosal lesions noted. A Pap smear was obtained of the proximal vagina. On bimanual and rectovaginal examination there no pelvic masses appreciated.   Radiographic Findings: No results found.  Impression:  No evidence for recurrence on clinical exam today, Pap smear pending.  Plan:  Routine followup 6 months after gynecologic oncology followup appointment. I have advised  her to followup with her primary care physician concerning her elevated blood pressure today.  _____________________________________  -----------------------------------  Billie Lade, PhD, MD

## 2013-05-05 NOTE — Progress Notes (Signed)
Pt denies pain, fatigue, vaginal discharge, urinary/bowel problems, loss of appetite. She states she is very stressed at work, and also states she has white coat syndrome. Pt using vaginal dilator 1-2 x weekly.

## 2013-05-12 ENCOUNTER — Telehealth: Payer: Self-pay | Admitting: *Deleted

## 2013-05-12 NOTE — Telephone Encounter (Signed)
Per Dr Roselind Messier, called pt and informed her that her 05/05/13 pap smear results are normal. Pt verbalized understanding and thanks.

## 2013-08-07 ENCOUNTER — Encounter: Payer: Self-pay | Admitting: Gynecologic Oncology

## 2013-08-07 ENCOUNTER — Ambulatory Visit: Payer: BC Managed Care – PPO | Attending: Gynecologic Oncology | Admitting: Gynecologic Oncology

## 2013-08-07 VITALS — BP 182/86 | HR 89 | Temp 97.7°F | Resp 18 | Ht 69.5 in | Wt 231.7 lb

## 2013-08-07 DIAGNOSIS — Z9079 Acquired absence of other genital organ(s): Secondary | ICD-10-CM | POA: Insufficient documentation

## 2013-08-07 DIAGNOSIS — Z90710 Acquired absence of both cervix and uterus: Secondary | ICD-10-CM | POA: Insufficient documentation

## 2013-08-07 DIAGNOSIS — Z923 Personal history of irradiation: Secondary | ICD-10-CM | POA: Insufficient documentation

## 2013-08-07 DIAGNOSIS — C549 Malignant neoplasm of corpus uteri, unspecified: Secondary | ICD-10-CM | POA: Insufficient documentation

## 2013-08-07 DIAGNOSIS — C541 Malignant neoplasm of endometrium: Secondary | ICD-10-CM

## 2013-08-07 NOTE — Patient Instructions (Signed)
Return to see radiation oncology in 6 months and GYN oncology in 12 months

## 2013-08-07 NOTE — Progress Notes (Signed)
Consult Note: Gyn-Onc  [name] 62 y.o. female  CC:  Chief Complaint  Patient presents with  . Endo ca    Follow up    HPI: The patient is very pleasant 62 year old who was referred by Dr. Gevena Cotton.   She had a 45-month history of postmenopausal bleeding that was bright red. She underwent an endometrial biopsy that revealed at least complex hyperplasia with atypia. Subsequently on May 09, 2011, she underwent robotic-assisted hysterectomy, bilateral salpingo-oophorectomy. Frozen section with a grade 1 endometrioid adenocarcinoma with at least if not more than 50% myometrial invasion. Final pathology revealed a grade 2 endometrioid adenocarcinoma with deep myometrial invasion of 0.6 to 1.1 cm for a total of approximately 55%. There was lymphovascular space involvement with a grade 2 lesion. 0 out of 11 lymph nodes were involved, 0 out of 1 left pelvic, 0 out of 4 right pelvic, 0 out of 5 right periaortic and 0 out of 1 left periaortic nodes being involved. Adnexa were negative. She is therefore stage IB grade 2 endometrioid adenocarcinoma. We reviewed the GOG data and at her age, she would require three risk factor, she has two including lymphovascular space involvement and grade 2 disease but she does not have sufficient myometrial invasion, though the cut off on the GOG was 66% and she has 55%. After we discussed that, she would at least want to speak with radiation-oncology and she ultimately decided to proceed with vaginal cuff brachytherapy.   Interval History: She tolerated her brachytherapy well. She had quite a bit of fatigue from that. She states that now that her energy level is improving she is really going to make a concerted effort to start exercising and to watch her diet. She has taken some Claritin for some allergy symptoms. She saw Dr. Roselind Messier in 6/14 and had a normal exam at that time. I last saw her 2/14 at which time her exam was negative. She had her MMG in the spring 2014,  she is due for a colonoscopy and plans on doing this before the end of 2014.  She is not exercising but would like to loose 30#.  Review of Systems:   Constitutional: Denies fever. Skin: No rash, sores, jaundice, itching, or dryness.  Cardiovascular: No chest pain, shortness of breath, or edema  Pulmonary: No cough or wheeze.  Gastro Intestinal: No nausea, vomiting, constipation, or diarrhea reported. No bright red blood per rectum or change in bowel movement.  Genitourinary: No frequency, urgency, or dysuria.  Denies vaginal bleeding and discharge.  Musculoskeletal: No myalgia, arthralgia, joint swelling or pain.  Neurologic: No weakness, numbness, or change in gait.  Psychology: No changes   Current Meds:  Outpatient Encounter Prescriptions as of 08/07/2013  Medication Sig Dispense Refill  . Loratadine (CLARITIN PO) Take by mouth as needed.        No facility-administered encounter medications on file as of 08/07/2013.    Allergy: No Known Allergies  Social Hx:   History   Social History  . Marital Status: Single    Spouse Name: N/A    Number of Children: N/A  . Years of Education: N/A   Occupational History  . Not on file.   Social History Main Topics  . Smoking status: Former Smoker -- 0.50 packs/day for 30 years    Types: Cigarettes    Quit date: 01/12/2011  . Smokeless tobacco: Not on file  . Alcohol Use: Yes     Comment: rarely  . Drug Use: No  .  Sexual Activity: No   Other Topics Concern  . Not on file   Social History Narrative  . No narrative on file    Past Surgical Hx:  Past Surgical History  Procedure Laterality Date  . Wisdom tooth extraction  1973  . Abdominal hysterectomy  05/09/11    Robotic TAH, BSO, and LND    Past Medical Hx:  Past Medical History  Diagnosis Date  . Endometrial adenocarcinoma     Stage IB grade 2  . History of brachytherapy Aug. 8, 14, 17, 29 Sept. 4    Intracavitary/Iridium 192  (3000 cGy)  . Blindness of left eye      partial blindness  . Retinal detachment   . History of radiation therapy 06/20/13/17/29/13&07/17/12    HDR endometrial adenocarcinoma    Oncology Hx:    Endometrial adenocarcinoma   05/09/2011 Initial Diagnosis Endometrial adenocarcinoma, IBG2    - 07/18/2011 Radiation Therapy last of 5 HDR brachytherapy     Family Hx:  Family History  Problem Relation Age of Onset  . Colon cancer Mother   . Aneurysm Father   . Colon cancer Brother   . Kidney failure Other     Vitals:  Blood pressure 182/86, pulse 89, temperature 97.7 F (36.5 C), resp. rate 18, height 5' 9.5" (1.765 m), weight 231 lb 11.2 oz (105.098 kg).  Physical Exam:  Well-nourished well-developed female in no acute distress.   Neck: No lymphadenopathy no thyromegaly.   Lungs: Clear to auscultation bilaterally.   Cardiovascular exam: Regular rate and rhythm.   Abdomen: Well-healed surgical incisions. Abdomen is soft nontender nondistended there are no palpable masses or hepatosplenomegaly.   Groins: No lymphadenopathy.   Pelvic: External genitalia within normal limits. The vagina is atrophic. The vaginal cuff is without lesions. Bimanual examination reveals no masses or nodularity. Rectal confirms.   Extremities: No edema   Assessment/Plan:  62 year old with a stage IB grade 2 endometrioid adenocarcinoma and is doing well after undergoing surgery and vaginal cuff brachytherapy. She will see Dr. Roselind Messier in 6 months and return to see me in 12 months. She will be responsible for scheduling her mammogram and colonoscopy.     [name] A., MD 08/07/2013, 8:53 AM

## 2014-02-02 ENCOUNTER — Encounter: Payer: Self-pay | Admitting: Radiation Oncology

## 2014-02-02 ENCOUNTER — Other Ambulatory Visit (HOSPITAL_COMMUNITY)
Admission: RE | Admit: 2014-02-02 | Discharge: 2014-02-02 | Disposition: A | Discharge status: Home / Self Care | Payer: BC Managed Care – PPO | Source: Ambulatory Visit | Attending: Radiation Oncology | Admitting: Radiation Oncology

## 2014-02-02 ENCOUNTER — Ambulatory Visit
Admission: RE | Admit: 2014-02-02 | Discharge: 2014-02-02 | Disposition: A | Discharge status: Home / Self Care | Payer: BC Managed Care – PPO | Source: Ambulatory Visit | Attending: Radiation Oncology | Admitting: Radiation Oncology

## 2014-02-02 VITALS — BP 163/108 | HR 82 | Temp 97.8°F | Ht 69.5 in | Wt 234.5 lb

## 2014-02-02 DIAGNOSIS — Z01419 Encounter for gynecological examination (general) (routine) without abnormal findings: Secondary | ICD-10-CM | POA: Insufficient documentation

## 2014-02-02 DIAGNOSIS — C541 Malignant neoplasm of endometrium: Secondary | ICD-10-CM

## 2014-02-02 NOTE — Progress Notes (Signed)
[name] here for follow up after treatment for endometrioid cancer.  She denies pain, pelvic cramping, rectal/vaginal bleeding and diarrhea.  She does feel fatigued because she did not sleep last night.  Her bp is elevated today at 163/108.  She reports that it is always high in the doctor's office.

## 2014-02-02 NOTE — Progress Notes (Signed)
  Radiation Oncology         [phone]) [phone] ________________________________  Name: [name] MRN: [id number]  Date: 02/02/2014  DOB: 1951/03/16  Follow-Up Visit Note  CC: Gennette Pac, MD  Sherol Dade., MD  Diagnosis:   Stage IB grade 2 endometrioid adenocarcinoma  Interval Since Last Radiation:  3-1/2 years  Narrative:  The patient returns today for routine follow-up.  She denies any vaginal bleeding pelvic pain or low back pain. She has been somewhat inconsistent with using her vaginal dilator and I have discussed the importance of this issue. Patient did see Dr. Alycia Rossetti 6 months ago with good report.                              ALLERGIES:  has No Known Allergies.  Meds: Current Outpatient Prescriptions  Medication Sig Dispense Refill  . Loratadine (CLARITIN PO) Take by mouth as needed.        No current facility-administered medications for this encounter.    Physical Findings: The patient is in no acute distress. Patient is alert and oriented.  height is 5' 9.5" (1.765 m) and weight is 234 lb 8 oz (106.369 kg). Her temperature is 97.8 F (36.6 C). Her blood pressure is 163/108 and her pulse is 82. Marland Kitchen No palpable supraclavicular or axillary adenopathy. The lungs are clear to auscultation. The heart has a regular rhythm and rate. The abdomen is soft and nontender with normal bowel sounds the inguinal areas are free of adenopathy. On pelvic examination the external genitalia are unremarkable. Speculum exam is performed. There are no mucosal lesions noted in the vaginal vault. A Pap smear was obtained of the proximal vagina. On bimanual and rectovaginal examination there no pelvic masses appreciated.  Lab Findings: Lab Results  Component Value Date   WBC 14.2* 05/10/2011   HGB 13.2 05/10/2011   HCT 40.5 05/10/2011   MCV 88.8 05/10/2011   PLT 198 05/10/2011     Radiographic Findings: No results found.  Impression:  No evidence of recurrence on clinical exam  today, Pap smear pending  Plan:  Routine followup in one year. In the interim the patient will be seen by gynecologic oncology.  I recommended she followup with her primary care physician concerning her elevated blood pressure.  ____________________________________ Blair Promise, MD

## 2014-02-06 ENCOUNTER — Ambulatory Visit: Payer: BC Managed Care – PPO

## 2014-02-06 ENCOUNTER — Telehealth: Payer: Self-pay | Admitting: Oncology

## 2014-02-06 ENCOUNTER — Ambulatory Visit
Admission: RE | Admit: 2014-02-06 | Discharge: 2014-02-06 | Disposition: A | Discharge status: Home / Self Care | Payer: BC Managed Care – PPO | Source: Ambulatory Visit

## 2014-02-06 ENCOUNTER — Other Ambulatory Visit: Payer: Self-pay

## 2014-02-06 DIAGNOSIS — Z1231 Encounter for screening mammogram for malignant neoplasm of breast: Secondary | ICD-10-CM

## 2014-02-06 NOTE — Telephone Encounter (Signed)
Called [name] to let her know that her pap smear results were good per Dr. Sondra Come.  [name] verbalized agreement.

## 2014-09-16 ENCOUNTER — Encounter: Payer: Self-pay | Admitting: Gynecologic Oncology

## 2014-09-16 ENCOUNTER — Other Ambulatory Visit (HOSPITAL_COMMUNITY)
Admission: RE | Admit: 2014-09-16 | Discharge: 2014-09-16 | Disposition: A | Discharge status: Home / Self Care | Payer: BC Managed Care – PPO | Source: Ambulatory Visit | Attending: Gynecologic Oncology | Admitting: Gynecologic Oncology

## 2014-09-16 ENCOUNTER — Ambulatory Visit: Payer: BC Managed Care – PPO | Attending: Gynecologic Oncology | Admitting: Gynecologic Oncology

## 2014-09-16 VITALS — BP 173/93 | HR 88 | Temp 98.2°F | Resp 16 | Ht 69.5 in | Wt 233.9 lb

## 2014-09-16 DIAGNOSIS — Z90710 Acquired absence of both cervix and uterus: Secondary | ICD-10-CM | POA: Insufficient documentation

## 2014-09-16 DIAGNOSIS — Z923 Personal history of irradiation: Secondary | ICD-10-CM | POA: Insufficient documentation

## 2014-09-16 DIAGNOSIS — Z90722 Acquired absence of ovaries, bilateral: Secondary | ICD-10-CM | POA: Insufficient documentation

## 2014-09-16 DIAGNOSIS — C541 Malignant neoplasm of endometrium: Secondary | ICD-10-CM | POA: Diagnosis present

## 2014-09-16 DIAGNOSIS — Z800 Family history of malignant neoplasm of digestive organs: Secondary | ICD-10-CM | POA: Diagnosis not present

## 2014-09-16 DIAGNOSIS — F17210 Nicotine dependence, cigarettes, uncomplicated: Secondary | ICD-10-CM | POA: Diagnosis not present

## 2014-09-16 DIAGNOSIS — Z01411 Encounter for gynecological examination (general) (routine) with abnormal findings: Secondary | ICD-10-CM | POA: Insufficient documentation

## 2014-09-16 NOTE — Progress Notes (Signed)
Consult Note: Gyn-Onc  [name] 63 y.o. female  CC:  Chief Complaint  Patient presents with  . Endo ca    Follow up    HPI: The patient is very pleasant 63 year old who was referred by [name].   She had a 67-month history of postmenopausal bleeding that was bright red. She underwent an endometrial biopsy that revealed at least complex hyperplasia with atypia. Subsequently on May 09, 2011, she underwent robotic-assisted hysterectomy, bilateral salpingo-oophorectomy. Frozen section with a grade 1 endometrioid adenocarcinoma with at least if not more than 50% myometrial invasion. Final pathology revealed a grade 2 endometrioid adenocarcinoma with deep myometrial invasion of 0.6 to 1.1 cm for a total of approximately 55%. There was lymphovascular space involvement with a grade 2 lesion. 0 out of 11 lymph nodes were involved, 0 out of 1 left pelvic, 0 out of 4 right pelvic, 0 out of 5 right periaortic and 0 out of 1 left periaortic nodes being involved. Adnexa were negative. She is therefore stage IB grade 2 endometrioid adenocarcinoma. We reviewed the GOG data and at her age, she would require three risk factor, she has two including lymphovascular space involvement and grade 2 disease but she does not have sufficient myometrial invasion, though the cut off on the GOG was 66% and she has 55%. After we discussed that, she would at least want to speak with radiation-oncology and she ultimately decided to proceed with vaginal cuff brachytherapy.   Interval History: She tolerated her brachytherapy well. She had quite a bit of fatigue from that. She states that now that her energy level is improving she is really going to make a concerted effort to start exercising and to watch her diet. She has taken some Claritin for some allergy symptoms. She saw Dr. Sondra Winters in 3/15 and had a normal exam at that time. I last saw her 9/14 at which time her exam was negative.she has overall been doing fairly  well. She states she is up-to-date on her mammograms. She has not had a follow up colonoscopy done. She has not been exercising due to being very busy at work. She is not sexually active but is able to use her vaginal dilators. She denies any bleeding or pain with the use of the vaginal dilators. There are no new medical problems and her family.   Review of Systems:   Constitutional: Denies fever. Skin: No rash, sores, jaundice, itching, or dryness.  Cardiovascular: No chest pain, shortness of breath, or edema  Pulmonary: No cough or wheeze.  Gastro Intestinal: No nausea, vomiting, constipation, or diarrhea reported. No bright red blood per rectum or change in bowel movement.  Genitourinary: No frequency, urgency, or dysuria.  Denies vaginal bleeding and discharge.  Musculoskeletal: No myalgia, arthralgia, joint swelling or pain.  Neurologic: No weakness, numbness, or change in gait.  Psychology: No changes   Current Meds:  Outpatient Encounter Prescriptions as of 09/16/2014  Medication Sig  . Loratadine (CLARITIN PO) Take by mouth as needed.     Allergy: No Known Allergies  Social Hx:   History   Social History  . Marital Status: Single    Spouse Name: N/A    Number of Children: N/A  . Years of Education: N/A   Occupational History  . Not on file.   Social History Main Topics  . Smoking status: Former Smoker -- 0.50 packs/day for 30 years    Types: Cigarettes    Quit date: 01/12/2011  . Smokeless tobacco:  Not on file  . Alcohol Use: Yes     Comment: rarely  . Drug Use: No  . Sexual Activity: No   Other Topics Concern  . Not on file   Social History Narrative    Past Surgical Hx:  Past Surgical History  Procedure Laterality Date  . Wisdom tooth extraction  1973  . Abdominal hysterectomy  05/09/11    Robotic TAH, BSO, and LND    Past Medical Hx:  Past Medical History  Diagnosis Date  . Endometrial adenocarcinoma     Stage IB grade 2  . History of  brachytherapy Aug. 8, 14, 17, 29 Sept. 4    Intracavitary/Iridium 192  (3000 cGy)  . Blindness of left eye     partial blindness  . Retinal detachment   . History of radiation therapy 06/20/13/17/29/13&07/17/12    HDR endometrial adenocarcinoma    Oncology Hx:    Endometrial adenocarcinoma   05/09/2011 Initial Diagnosis Endometrial adenocarcinoma, IBG2    - 07/18/2011 Radiation Therapy last of 5 HDR brachytherapy     Family Hx:  Family History  Problem Relation Age of Onset  . Colon cancer Mother   . Aneurysm Father   . Colon cancer Brother   . Kidney failure Other     Vitals:  Blood pressure 173/93, pulse 88, temperature 98.2 F (36.8 C), temperature source Oral, resp. rate 16, height 5' 9.5" (1.765 m), weight 233 lb 14.4 oz (106.096 kg).  Physical Exam:  Well-nourished well-developed female in no acute distress.   Neck: No lymphadenopathy no thyromegaly.   Lungs: Clear to auscultation bilaterally.   Cardiovascular exam: Regular rate and rhythm.   Abdomen: Well-healed surgical incisions. Abdomen is soft nontender nondistended there are no palpable masses or hepatosplenomegaly.   Groins: No lymphadenopathy.   Pelvic: External genitalia within normal limits. The vagina is atrophic. The vaginal cuff is without lesions. Pap smear was submitted without difficulty. Bimanual examination reveals no masses or nodularity. Rectal confirms.   Extremities: No edema   Assessment/Plan:  63 year old with a stage IB grade 2 endometrioid adenocarcinoma and is doing well after undergoing surgery and vaginal cuff brachytherapy. She will see Dr. Sondra Winters in 6 months and return to see me in 12 months. She will be responsible for scheduling her colonoscopy. She was encouraged to begin exercising even if just trying to walk for 30 minutes a day.     [name] A., MD 09/16/2014, 9:44 AM

## 2014-09-16 NOTE — Patient Instructions (Signed)
Follow-up with Dr. Alycia Rossetti in one year and with radiation oncology in 6 months. We will notify you of the results for your Pap smear from today. Please try to get 30 minutes of exercise a day.

## 2014-09-21 LAB — CYTOLOGY - PAP

## 2014-09-22 ENCOUNTER — Telehealth: Payer: Self-pay | Admitting: *Deleted

## 2014-09-22 NOTE — Telephone Encounter (Signed)
-----   Message from Dorothyann Gibbs, NP sent at 09/22/2014 10:37 AM EST ----- Please let her know that her pap smear is normal.   Thank you  ----- Message -----    From: [facility] [place] [facility]    Sent: 09/21/2014   4:27 PM      To: Dorothyann Gibbs, NP

## 2014-09-22 NOTE — Telephone Encounter (Signed)
Notified pt pap smear was normal. Pt to call office with any concerns.

## 2015-02-09 ENCOUNTER — Ambulatory Visit
Admission: RE | Admit: 2015-02-09 | Discharge: 2015-02-09 | Disposition: A | Discharge status: Home / Self Care | Payer: BC Managed Care – PPO | Source: Ambulatory Visit

## 2015-02-09 DIAGNOSIS — Z1231 Encounter for screening mammogram for malignant neoplasm of breast: Secondary | ICD-10-CM

## 2019-08-14 DIAGNOSIS — I10 Essential (primary) hypertension: Secondary | ICD-10-CM

## 2019-08-14 HISTORY — DX: Essential (primary) hypertension: I10

## 2019-08-30 ENCOUNTER — Ambulatory Visit (HOSPITAL_COMMUNITY)
Admission: EM | Admit: 2019-08-30 | Discharge: 2019-08-30 | Disposition: A | Discharge status: Home / Self Care | Payer: Medicare Other | Attending: Family Medicine | Admitting: Family Medicine

## 2019-08-30 ENCOUNTER — Encounter (HOSPITAL_COMMUNITY): Payer: Self-pay

## 2019-08-30 ENCOUNTER — Other Ambulatory Visit: Payer: Self-pay

## 2019-08-30 DIAGNOSIS — R519 Headache, unspecified: Secondary | ICD-10-CM | POA: Diagnosis present

## 2019-08-30 DIAGNOSIS — Z87891 Personal history of nicotine dependence: Secondary | ICD-10-CM | POA: Insufficient documentation

## 2019-08-30 DIAGNOSIS — R05 Cough: Secondary | ICD-10-CM | POA: Insufficient documentation

## 2019-08-30 DIAGNOSIS — R0982 Postnasal drip: Secondary | ICD-10-CM | POA: Diagnosis not present

## 2019-08-30 DIAGNOSIS — H5462 Unqualified visual loss, left eye, normal vision right eye: Secondary | ICD-10-CM | POA: Diagnosis not present

## 2019-08-30 DIAGNOSIS — Z20828 Contact with and (suspected) exposure to other viral communicable diseases: Secondary | ICD-10-CM | POA: Insufficient documentation

## 2019-08-30 MED ORDER — IPRATROPIUM BROMIDE 0.06 % NA SOLN: 2.0000 | Freq: Three times a day (TID) | NASAL | 12 refills | Status: DC | PRN

## 2019-08-30 NOTE — ED Triage Notes (Signed)
Pt states she has a post nasal drip x 6 to 8 weeks.  [facility] She says that she just wanted to come in the be checked out again. Pt states she went there this morning.

## 2019-08-30 NOTE — ED Provider Notes (Signed)
[facility]    CSN: [id number] Arrival date & time: 08/30/19  1016      History   Chief Complaint Chief Complaint  Patient presents with  . Facial Pain    HPI [name] is a 68 y.o. female.   Rogan A Manka presents with complaints of post nasal drip. Minimal cough. Otherwise feels well. Comes and goes. What she has eaten or drank seems to make a difference, uses dairy as an example, stating that decreased intake of dairy helps.  No sore throat or heartburn. Feels "dripping" to back of throat. Currently minimal symptoms. No chest pain or shortness of breath. Was taking a lot of mucinex, then started to take claritin and noted that she developed vertigo and facial pressure, so she stopped this. Following this was seen 10/9, given 7 days of amoxicillin , finished 10/5. Feeling better. Now still with post nasal drip. No fevers. Denies any previous issues like this in the past. No ear symptoms today. No further vertigo. Not taking any other medications for symptoms. Doesn't follow with a PCP. Spicy or fatty foods have nto been noted to worsen symptoms.     ROS per HPI, negative if not otherwise mentioned.      Past Medical History:  Diagnosis Date  . Blindness of left eye    partial blindness  . Endometrial adenocarcinoma ([facility])    Stage IB grade 2  . History of brachytherapy Aug. 8, 14, 17, 29 Sept. 4   Intracavitary/Iridium 192  (3000 cGy)  . History of radiation therapy 06/20/13/17/29/13&07/17/12   HDR endometrial adenocarcinoma  . Retinal detachment     Patient Active Problem List   Diagnosis Date Noted  . History of radiation therapy   . Endometrial adenocarcinoma ([place])   . History of brachytherapy   . Endometrial cancer ([place]) 11/01/2011    Past Surgical History:  Procedure Laterality Date  . ABDOMINAL HYSTERECTOMY  05/09/11   Robotic TAH, BSO, and LND  . WISDOM TOOTH EXTRACTION  1973    OB History   No obstetric history on file.       Home Medications    Prior to Admission medications   Medication Sig Start Date End Date Taking? Authorizing Provider  ipratropium (ATROVENT) 0.06 % nasal spray Place 2 sprays into both nostrils 3 (three) times daily as needed for rhinitis. 08/30/19   Zigmund Gottron, NP  Loratadine (CLARITIN PO) Take by mouth as needed.     [provider]    Family History Family History  Problem Relation Age of Onset  . Colon cancer Mother   . Aneurysm Father   . Colon cancer Brother   . Kidney failure Other     Social History Social History   Tobacco Use  . Smoking status: Former Smoker    Packs/day: 0.50    Years: 30.00    Pack years: 15.00    Types: Cigarettes    Quit date: 01/12/2011    Years since quitting: 8.6  . Smokeless tobacco: Never Used  Substance Use Topics  . Alcohol use: Yes    Comment: rarely  . Drug use: No     Allergies   Patient has no known allergies.   Review of Systems Review of Systems   Physical Exam Triage Vital Signs ED Triage Vitals  Enc Vitals Group     BP 08/30/19 1045 (!) 156/94     Pulse Rate 08/30/19 1045 95     Resp 08/30/19 1045  18     Temp 08/30/19 1045 98.4 F (36.9 C)     Temp src --      SpO2 08/30/19 1045 98 %     Weight 08/30/19 1044 245 lb (111.1 kg)     Height --      Head Circumference --      Peak Flow --      Pain Score 08/30/19 1044 0     Pain Loc --      Pain Edu? --      Excl. in [place]? --    No data found.  Updated Vital Signs BP (!) 156/94 (BP Location: Right Arm)   Pulse 95   Temp 98.4 F (36.9 C)   Resp 18   Wt 245 lb (111.1 kg)   SpO2 98%   BMI 35.66 kg/m    Physical Exam Constitutional:      General: She is not in acute distress.    Appearance: She is well-developed.  HENT:     Right Ear: Tympanic membrane and ear canal normal.     Left Ear: Tympanic membrane and ear canal normal.     Nose: Nose normal.     Right Sinus: No maxillary sinus tenderness or frontal sinus tenderness.      Left Sinus: No maxillary sinus tenderness or frontal sinus tenderness.     Mouth/Throat:     Mouth: Mucous membranes are moist.     Pharynx: No oropharyngeal exudate or posterior oropharyngeal erythema.  Cardiovascular:     Rate and Rhythm: Normal rate.  Pulmonary:     Effort: Pulmonary effort is normal.  Skin:    General: Skin is warm and dry.  Neurological:     Mental Status: She is alert and oriented to person, place, and time.      UC Treatments / Results  Labs (all labs ordered are listed, but only abnormal results are displayed) Labs Reviewed  NOVEL CORONAVIRUS, NAA (HOSP ORDER, SEND-OUT TO REF LAB; TAT 18-24 HRS)    EKG   Radiology No results found.  Procedures Procedures (including critical care time)  Medications Ordered in UC Medications - No data to display  Initial Impression / Assessment and Plan / UC Course  I have reviewed the triage vital signs and the nursing notes.  Pertinent labs & imaging results that were available during my care of the patient were reviewed by me and considered in my medical decision making (see chart for details).     Non toxic. Benign physical exam. Afebrile. Minimal complaints, primarily of post nasal drip. She does not wish to continue with any antihistamine. Discussed use of nasal spray to better manage symptoms. Reflux also considered. Encouraged follow up with PCP. Return precautions provided. Patient verbalized understanding and agreeable to plan.   Final Clinical Impressions(s) / UC Diagnoses   Final diagnoses:  Post-nasal drip     Discharge Instructions     You may continue to use nasal saline to moisturize nares.  We will try another nasal spray which can be used 2-4 times a day as needed for nasal symptom.  Push fluids to ensure adequate hydration and keep secretions thin.   Exam is overall reassuring here today.  The other thing to consider is gastric reflux which can cause symptoms within the throat. However  it does not seem like triggers are consistent with this.  I do recommend establishing with a primary care provider for recheck and management.  If develop fevers, pain, or  otherwise worsening don't hesitate to return.     ED Prescriptions    Medication Sig Dispense Auth. Provider   ipratropium (ATROVENT) 0.06 % nasal spray Place 2 sprays into both nostrils 3 (three) times daily as needed for rhinitis. 15 mL Zigmund Gottron, NP     PDMP not reviewed this encounter.   Zigmund Gottron, NP 08/30/19 1209

## 2019-08-30 NOTE — Discharge Instructions (Signed)
You may continue to use nasal saline to moisturize nares.  We will try another nasal spray which can be used 2-4 times a day as needed for nasal symptom.  Push fluids to ensure adequate hydration and keep secretions thin.   Exam is overall reassuring here today.  The other thing to consider is gastric reflux which can cause symptoms within the throat. However it does not seem like triggers are consistent with this.  I do recommend establishing with a primary care provider for recheck and management.  If develop fevers, pain, or otherwise worsening don't hesitate to return.

## 2019-08-31 LAB — NOVEL CORONAVIRUS, NAA (HOSP ORDER, SEND-OUT TO REF LAB; TAT 18-24 HRS): SARS-CoV-2, NAA: NOT DETECTED

## 2019-09-04 ENCOUNTER — Other Ambulatory Visit: Payer: Self-pay | Admitting: Medical

## 2019-09-04 ENCOUNTER — Encounter: Payer: Self-pay | Admitting: Medical

## 2019-09-04 ENCOUNTER — Other Ambulatory Visit: Payer: Self-pay

## 2019-09-04 ENCOUNTER — Ambulatory Visit
Admission: RE | Admit: 2019-09-04 | Discharge: 2019-09-04 | Disposition: A | Discharge status: Home / Self Care | Payer: Medicare Other | Source: Ambulatory Visit | Attending: Medical | Admitting: Medical

## 2019-09-04 ENCOUNTER — Ambulatory Visit: Payer: Medicare Other | Admitting: Medical

## 2019-09-04 VITALS — BP 180/116 | HR 98 | Temp 97.7°F | Ht 69.5 in | Wt 240.4 lb

## 2019-09-04 DIAGNOSIS — R0602 Shortness of breath: Secondary | ICD-10-CM

## 2019-09-04 DIAGNOSIS — I10 Essential (primary) hypertension: Secondary | ICD-10-CM | POA: Insufficient documentation

## 2019-09-04 DIAGNOSIS — R918 Other nonspecific abnormal finding of lung field: Secondary | ICD-10-CM | POA: Insufficient documentation

## 2019-09-04 DIAGNOSIS — J069 Acute upper respiratory infection, unspecified: Secondary | ICD-10-CM | POA: Diagnosis not present

## 2019-09-04 DIAGNOSIS — Z87891 Personal history of nicotine dependence: Secondary | ICD-10-CM | POA: Diagnosis not present

## 2019-09-04 LAB — POCT URINALYSIS DIP (PROADVANTAGE DEVICE)
Bilirubin, UA: NEGATIVE
Blood, UA: NEGATIVE
Glucose, UA: NEGATIVE mg/dL
Leukocytes, UA: NEGATIVE
Nitrite, UA: NEGATIVE
Protein Ur, POC: NEGATIVE mg/dL
Specific Gravity, Urine: 1.01
Urobilinogen, Ur: NEGATIVE
pH, UA: 6 (ref 5.0–8.0)

## 2019-09-04 LAB — CBC
Hematocrit: 44.7 % (ref 34.0–46.6)
Hemoglobin: 15.3 g/dL (ref 11.1–15.9)
MCH: 30.4 pg (ref 26.6–33.0)
MCHC: 34.2 g/dL (ref 31.5–35.7)
MCV: 89 fL (ref 79–97)
Platelets: 238 10*3/uL (ref 150–450)
RBC: 5.04 x10E6/uL (ref 3.77–5.28)
RDW: 14.1 % (ref 11.7–15.4)
WBC: 9.5 10*3/uL (ref 3.4–10.8)

## 2019-09-04 LAB — BASIC METABOLIC PANEL
BUN/Creatinine Ratio: 10 — ABNORMAL LOW (ref 12–28)
BUN: 7 mg/dL — ABNORMAL LOW (ref 8–27)
CO2: 27 mmol/L (ref 20–29)
Calcium: 9.8 mg/dL (ref 8.7–10.3)
Chloride: 106 mmol/L (ref 96–106)
Creatinine, Ser: 0.7 mg/dL (ref 0.57–1.00)
GFR calc Af Amer: 104 mL/min/{1.73_m2} (ref >59)
GFR calc non Af Amer: 91 mL/min/{1.73_m2} (ref >59)
Glucose: 113 mg/dL — ABNORMAL HIGH (ref 65–99)
Potassium: 4.5 mmol/L (ref 3.5–5.2)
Sodium: 141 mmol/L (ref 134–144)

## 2019-09-04 MED ORDER — AZITHROMYCIN 250 MG PO TABS: ORAL_TABLET | ORAL | 0 refills | Status: DC

## 2019-09-04 MED ORDER — ATENOLOL-CHLORTHALIDONE 50-25 MG PO TABS: 1.0000 | ORAL_TABLET | Freq: Every day | ORAL | 1 refills | Status: DC

## 2019-09-04 NOTE — Progress Notes (Signed)
Subjective:  [name] is a 68 y.o. female who presents for Chief Complaint  Patient presents with  . New Patient (Initial Visit)  . Hypertension     New patient today  She reports not really been good about seeing primary care for many years.  Last routine care was probably 2015.  During that time blood pressures would be elevated in the doctor's office but would be normal at home.  She has not been checking her blood pressure in general in recent months.  She was just seen in urgent care on October nights for cold symptoms/bronchitis, blood pressure was quite high then.  She was given amoxicillin which really helped her vertigo and respiratory symptoms.  She still has some postnasal drip but feels a lot better.  She is mainly here because blood pressures are staying really high and was told to follow-up soon with primary care  She denies chest pain, no edema, no palpitations.  She notes getting allergy and cold symptoms back in early part of this year when Covid started and shortly thereafter started having shortness of breath.  So has remained somewhat short of breath for months.  This has not really changed, no worse or no better.  She notes history of smoking but quit in 2012.  She smoked for probably 30+ years between 1/2 to 1 pack a day.  No personal history of kidney disease or heart disease but her father died of either heart attack or aneurysm.  No other aggravating or relieving factors.    No other c/o.  The following portions of the patient's history were reviewed and updated as appropriate: allergies, current medications, past family history, past medical history, past social history, past surgical history and problem list.  ROS Otherwise as in subjective above  Past Medical History:  Diagnosis Date  . Allergy   . Arthritis   . Blindness of left eye    partial blindness  . Endometrial adenocarcinoma (HCC)    Stage IB grade 2  . History of brachytherapy  Aug. 8, 14, 17, 29 Sept. 4   Intracavitary/Iridium 192  (3000 cGy)  . History of radiation therapy 06/20/13/17/29/13&07/17/12   HDR endometrial adenocarcinoma  . Hypertension 08/2019  . Obesity   . Retinal detachment    Current Outpatient Medications on File Prior to Visit  Medication Sig Dispense Refill  . ipratropium (ATROVENT) 0.06 % nasal spray Place 2 sprays into both nostrils 3 (three) times daily as needed for rhinitis. 15 mL 12  . Loratadine (CLARITIN PO) Take by mouth as needed.      No current facility-administered medications on file prior to visit.      Objective: BP (!) 180/116   Pulse 98   Temp 97.7 F (36.5 C)   Ht 5' 9.5" (1.765 m)   Wt 240 lb 6.4 oz (109 kg)   SpO2 96%   BMI 34.99 kg/m   General appearance: alert, no distress, well developed, well nourished, obese white female HEENT: normocephalic, sclerae anicteric, conjunctiva pink and moist, TMs pearly, nares patent, no discharge or erythema, pharynx with some postnasal drainage  oral cavity: MMM, no lesions Neck: supple, no lymphadenopathy, no thyromegaly, no masses, no bruits, no JVD Heart: Occasional skipped beat, possible faint murmur heard in upper sternal borders, otherwise mildly tachycardic RRR, normal S1, S2 Lungs: Decreased lung sounds in general, particularly lower fields, no wheezes, rhonchi, or rales Pulses: 2+ radial pulses, 2+ pedal pulses, normal cap refill Ext: no edema  EKG  indication, hypertension, rate 101 bpm, PR interval 178 ms, QRS 94 ms, QTC 483 ms, axis -21 degrees, sinus tachycardia, no prior EKG to compare, on rhythm strip there is occasional PVC.   Assessment: Encounter Diagnoses  Name Primary?  Marland Kitchen Uncontrolled hypertension Yes  . Former smoker   . Abnormal lung field   . Recent upper respiratory tract infection   . SOB (shortness of breath)      Plan: We discussed her concerns.    I reviewed her recent urgent care visit.  I reviewed her recent negative Covid test  We  discussed her lack of medical care in recent years.  We discussed her uncontrolled hypertension, possible complications of hypertension including kidney disease, heart disease, congestive heart failure, eye disease, stroke, heart attack.  I will send him for chest x-ray today.  We reviewed EKG findings.  Pending labs and chest x-ray today we will start her on blood pressure medication.  Discussed the goals of therapy.  Discussed need for diet exercise and weight loss changes.  Discussed short-term follow-up.  Discussed low-salt diet.  Answered her questions.  Lama was seen today for new patient (initial visit) and hypertension.  Diagnoses and all orders for this visit:  Uncontrolled hypertension -     EKG 12-Lead -     DG Chest 2 View; Future -     Basic metabolic panel -     CBC -     Brain natriuretic peptide -     POCT Urinalysis DIP (Proadvantage Device)  Former smoker -     Brain natriuretic peptide  Abnormal lung field -     Brain natriuretic peptide  Recent upper respiratory tract infection  SOB (shortness of breath) -     EKG 12-Lead -     DG Chest 2 View; Future -     Basic metabolic panel -     CBC -     Brain natriuretic peptide    Follow up: pending labs, imaging

## 2019-09-05 LAB — BRAIN NATRIURETIC PEPTIDE: BNP: 10.3 pg/mL (ref 0.0–100.0)

## 2019-09-10 ENCOUNTER — Encounter: Payer: Self-pay | Admitting: Medical

## 2019-09-10 ENCOUNTER — Other Ambulatory Visit: Payer: Self-pay

## 2019-09-10 ENCOUNTER — Ambulatory Visit: Payer: Medicare Other | Admitting: Medical

## 2019-09-10 VITALS — BP 146/88 | HR 72 | Temp 96.9°F | Ht 70.0 in | Wt 233.4 lb

## 2019-09-10 DIAGNOSIS — Z8542 Personal history of malignant neoplasm of other parts of uterus: Secondary | ICD-10-CM | POA: Insufficient documentation

## 2019-09-10 DIAGNOSIS — E2839 Other primary ovarian failure: Secondary | ICD-10-CM | POA: Insufficient documentation

## 2019-09-10 DIAGNOSIS — Z1211 Encounter for screening for malignant neoplasm of colon: Secondary | ICD-10-CM | POA: Insufficient documentation

## 2019-09-10 DIAGNOSIS — I10 Essential (primary) hypertension: Secondary | ICD-10-CM

## 2019-09-10 DIAGNOSIS — Z780 Asymptomatic menopausal state: Secondary | ICD-10-CM | POA: Diagnosis not present

## 2019-09-10 DIAGNOSIS — Z0000 Encounter for general adult medical examination without abnormal findings: Secondary | ICD-10-CM | POA: Insufficient documentation

## 2019-09-10 DIAGNOSIS — K635 Polyp of colon: Secondary | ICD-10-CM | POA: Insufficient documentation

## 2019-09-10 DIAGNOSIS — Z1239 Encounter for other screening for malignant neoplasm of breast: Secondary | ICD-10-CM | POA: Diagnosis not present

## 2019-09-10 NOTE — Patient Instructions (Addendum)
Please call to schedule your mammogram and bone density test.  [facility]  [id number] [place], [place] [address]   Continue your current blood pressure medication daily.   Limit salt and junk food.   Goal blood pressure in 1 month is <130/80.  Exercise daily   We will make referral for colonoscopy.   It is recommended to have yearly flu shot, at this age, pneumococcal 89 and Prevnar 13 pneumonia vaccines, Shingrix vaccine and tetanus booster every 10 years.   please check insurance coverage for these vaccines.

## 2019-09-10 NOTE — Progress Notes (Signed)
Subjective: Chief Complaint  Patient presents with  . Hypertension    follow up    Here for short term follow up.   I saw her as a new patient last week.  Last week we started her on atenolol chlorthalidone.  She is tolerating this just fine.  Feels ok, sleeping well.  Not getting dizziness when changing positions.   Denies chest pain, edema, palpitations, no headache no blurred vision.  She has several of the questions today   She has questions about vitamins and supplements.  Her brother has a history of malignant hyperthermia, wanted advice on this.  She is past due for cancer screens.  She has a history of endometrial cancer.  No other complaint.  Past Medical History:  Diagnosis Date  . Allergy   . Arthritis   . Blindness of left eye    partial blindness  . Endometrial adenocarcinoma (HCC)    Stage IB grade 2  . History of brachytherapy Aug. 8, 14, 17, 29 Sept. 4   Intracavitary/Iridium 192  (3000 cGy)  . History of radiation therapy 06/20/13/17/29/13&07/17/12   HDR endometrial adenocarcinoma  . Hypertension 08/2019  . Obesity   . Retinal detachment    Current Outpatient Medications on File Prior to Visit  Medication Sig Dispense Refill  . atenolol-chlorthalidone (TENORETIC) 50-25 MG tablet Take 1 tablet by mouth daily. 30 tablet 1  . azithromycin (ZITHROMAX) 250 MG tablet 2 tablets day 1, then 1 tablet days 2-4 (Patient not taking: Reported on 09/10/2019) 6 tablet 0  . ipratropium (ATROVENT) 0.06 % nasal spray Place 2 sprays into both nostrils 3 (three) times daily as needed for rhinitis. (Patient not taking: Reported on 09/10/2019) 15 mL 12  . Loratadine (CLARITIN PO) Take by mouth as needed.      No current facility-administered medications on file prior to visit.    ROS as in subjective  Objective: BP (!) 146/88   Pulse 72   Temp (!) 96.9 F (36.1 C)   Ht 5\' 10"  (1.778 m)   Wt 233 lb 6.4 oz (105.9 kg)   SpO2 95%   BMI 33.49 kg/m   General appearance:  alert, no distress, WD/WN,  Neck: supple, no lymphadenopathy, no thyromegaly, no masses Heart: RRR, normal S1, S2, no murmurs Lungs: CTA bilaterally, no wheezes, rhonchi, or rales Ext: no edema Pulses: 2+ symmetric, upper and lower extremities, normal cap refill     Assessment: Encounter Diagnoses  Name Primary?  Marland Kitchen Uncontrolled hypertension Yes  . Encounter for screening for malignant neoplasm of breast, unspecified screening modality   . Post-menopausal   . Estrogen deficiency   . History of endometrial cancer   . Polyp of colon, unspecified part of colon, unspecified type   . Screen for colon cancer      Plan: Hypertension-improved on atenolol chlorthalidone.  Continue current medication, counseled on diet, exercise, salt limitation, and follow-up in 4-6 weeks for physical and recheck  We discussed her several questions.  I went ahead and ordered mammogram and bone density test.  She will schedule these  We will refer back to Dr. Paulita Fujita, gastroenterology for updated colonoscopy.  She is past due by several years.  Counseled on calcium and vitamin D supplementation, exercise, healthy diet   Dior was seen today for hypertension.  Diagnoses and all orders for this visit:  Uncontrolled hypertension  Encounter for screening for malignant neoplasm of breast, unspecified screening modality -     MM DIGITAL SCREENING BILATERAL; Future  Post-menopausal -     DG Bone Density; Future  Estrogen deficiency -     DG Bone Density; Future  History of endometrial cancer  Polyp of colon, unspecified part of colon, unspecified type -     Ambulatory referral to Gastroenterology  Screen for colon cancer -     Ambulatory referral to Gastroenterology

## 2019-09-16 ENCOUNTER — Other Ambulatory Visit: Payer: Self-pay

## 2019-09-16 ENCOUNTER — Ambulatory Visit
Admission: RE | Admit: 2019-09-16 | Discharge: 2019-09-16 | Disposition: A | Discharge status: Home / Self Care | Payer: Medicare Other | Source: Ambulatory Visit | Attending: Medical | Admitting: Medical

## 2019-09-16 DIAGNOSIS — Z1239 Encounter for other screening for malignant neoplasm of breast: Secondary | ICD-10-CM

## 2019-09-17 ENCOUNTER — Ambulatory Visit: Payer: Self-pay | Admitting: Medical

## 2019-09-18 ENCOUNTER — Ambulatory Visit
Admission: RE | Admit: 2019-09-18 | Discharge: 2019-09-18 | Disposition: A | Discharge status: Home / Self Care | Payer: Medicare Other | Source: Ambulatory Visit | Attending: Medical | Admitting: Medical

## 2019-09-18 ENCOUNTER — Other Ambulatory Visit: Payer: Self-pay

## 2019-09-18 DIAGNOSIS — E2839 Other primary ovarian failure: Secondary | ICD-10-CM

## 2019-09-18 DIAGNOSIS — Z780 Asymptomatic menopausal state: Secondary | ICD-10-CM

## 2019-09-22 ENCOUNTER — Telehealth: Payer: Self-pay

## 2019-09-22 NOTE — Telephone Encounter (Signed)
error 

## 2019-10-07 ENCOUNTER — Other Ambulatory Visit: Payer: Self-pay

## 2019-10-07 ENCOUNTER — Ambulatory Visit: Payer: Medicare Other | Admitting: Medical

## 2019-10-07 ENCOUNTER — Encounter: Payer: Self-pay | Admitting: Medical

## 2019-10-07 VITALS — BP 136/80 | HR 74 | Temp 96.4°F | Ht 70.0 in | Wt 225.6 lb

## 2019-10-07 DIAGNOSIS — Z23 Encounter for immunization: Secondary | ICD-10-CM

## 2019-10-07 DIAGNOSIS — Z7189 Other specified counseling: Secondary | ICD-10-CM

## 2019-10-07 DIAGNOSIS — E2839 Other primary ovarian failure: Secondary | ICD-10-CM

## 2019-10-07 DIAGNOSIS — Z0000 Encounter for general adult medical examination without abnormal findings: Secondary | ICD-10-CM | POA: Diagnosis not present

## 2019-10-07 DIAGNOSIS — Z87891 Personal history of nicotine dependence: Secondary | ICD-10-CM

## 2019-10-07 DIAGNOSIS — Z780 Asymptomatic menopausal state: Secondary | ICD-10-CM

## 2019-10-07 DIAGNOSIS — Z1211 Encounter for screening for malignant neoplasm of colon: Secondary | ICD-10-CM

## 2019-10-07 DIAGNOSIS — M8580 Other specified disorders of bone density and structure, unspecified site: Secondary | ICD-10-CM | POA: Insufficient documentation

## 2019-10-07 DIAGNOSIS — Z923 Personal history of irradiation: Secondary | ICD-10-CM

## 2019-10-07 DIAGNOSIS — Z136 Encounter for screening for cardiovascular disorders: Secondary | ICD-10-CM

## 2019-10-07 DIAGNOSIS — K635 Polyp of colon: Secondary | ICD-10-CM

## 2019-10-07 DIAGNOSIS — Z8542 Personal history of malignant neoplasm of other parts of uterus: Secondary | ICD-10-CM

## 2019-10-07 DIAGNOSIS — M25562 Pain in left knee: Secondary | ICD-10-CM | POA: Insufficient documentation

## 2019-10-07 DIAGNOSIS — Z7185 Encounter for immunization safety counseling: Secondary | ICD-10-CM

## 2019-10-07 DIAGNOSIS — Z131 Encounter for screening for diabetes mellitus: Secondary | ICD-10-CM

## 2019-10-07 DIAGNOSIS — Z13220 Encounter for screening for lipoid disorders: Secondary | ICD-10-CM

## 2019-10-07 DIAGNOSIS — Z1159 Encounter for screening for other viral diseases: Secondary | ICD-10-CM

## 2019-10-07 DIAGNOSIS — R0982 Postnasal drip: Secondary | ICD-10-CM | POA: Insufficient documentation

## 2019-10-07 NOTE — Patient Instructions (Signed)
Thanks for trusting [place] with your health care and for coming in for a physical today.  Below are some general recommendations I have for you:  Yearly screenings See your eye doctor yearly for routine vision care. See your dentist yearly for routine dental care including hygiene visits twice yearly. See me here yearly for a routine physical and preventative care visit   Cancer screening Colon cancer screening:  Follow up as planned in January with Dr. Paulita Fujita from our recent referral  Breast cancer screening -  You are up to date from your recent 09/2019 mammogram  Return at your conveinece for breast and pelvic exam    Vaccines: Check insurance coverage for Tetanus and Shingles vaccines   We will check baseline labs today   Consider baseline cardiology consult for heart disease screening.  We can refer if you are agreeable.   Continue your blood pressure medication     I have included other useful information below for your review.  Preventative Care for Adults - Female      MAINTAIN REGULAR HEALTH EXAMS:  A routine yearly physical is a good way to check in with your primary care provider about your health and preventive screening. It is also an opportunity to share updates about your health and any concerns you have, and receive a thorough all-over exam.   Most health insurance companies pay for at least some preventative services.  Check with your health plan for specific coverages.  WHAT PREVENTATIVE SERVICES DO WOMEN NEED?  Adult women should have their weight and blood pressure checked regularly.   Women age 6 and older should have their cholesterol levels checked regularly.  Women should be screened for cervical cancer with a Pap smear and pelvic exam beginning at either age 85, or 3 years after they become sexually activity.    Breast cancer screening generally begins at age 21 with a mammogram and breast exam by your primary care provider.    Beginning at  age 50 and continuing to age 51, women should be screened for colorectal cancer.  Certain people may need continued testing until age 64.  Updating vaccinations is part of preventative care.  Vaccinations help protect against diseases such as the flu.  Osteoporosis is a disease in which the bones lose minerals and strength as we age. Women ages 53 and over should discuss this with their caregivers, as should women after menopause who have other risk factors.  Lab tests are generally done as part of preventative care to screen for anemia and blood disorders, to screen for problems with the kidneys and liver, to screen for bladder problems, to check blood sugar, and to check your cholesterol level.  Preventative services generally include counseling about diet, exercise, avoiding tobacco, drugs, excessive alcohol consumption, and sexually transmitted infections.    GENERAL RECOMMENDATIONS FOR GOOD HEALTH:  Healthy diet:  Eat a variety of foods, including fruit, vegetables, animal or vegetable protein, such as meat, fish, chicken, and eggs, or beans, lentils, tofu, and grains, such as rice.  Drink plenty of water daily.  Decrease saturated fat in the diet, avoid lots of red meat, processed foods, sweets, fast foods, and fried foods.  Exercise:  Aerobic exercise helps maintain good heart health. At least 30-40 minutes of moderate-intensity exercise is recommended. For example, a brisk walk that increases your heart rate and breathing. This should be done on most days of the week.   Find a type of exercise or a variety of exercises  that you enjoy so that it becomes a part of your daily life.  Examples are running, walking, swimming, water aerobics, and biking.  For motivation and support, explore group exercise such as aerobic class, spin class, Zumba, Yoga,or  martial arts, etc.    Set exercise goals for yourself, such as a certain weight goal, walk or run in a race such as a 5k walk/run.   Speak to your primary care provider about exercise goals.  Disease prevention:  If you smoke or chew tobacco, find out from your caregiver how to quit. It can literally save your life, no matter how long you have been a tobacco user. If you do not use tobacco, never begin.   Maintain a healthy diet and normal weight. Increased weight leads to problems with blood pressure and diabetes.   The Body Mass Index or BMI is a way of measuring how much of your body is fat. Having a BMI above 27 increases the risk of heart disease, diabetes, hypertension, stroke and other problems related to obesity. Your caregiver can help determine your BMI and based on it develop an exercise and dietary program to help you achieve or maintain this important measurement at a healthful level.  High blood pressure causes heart and blood vessel problems.  Persistent high blood pressure should be treated with medicine if weight loss and exercise do not work.   Fat and cholesterol leaves deposits in your arteries that can block them. This causes heart disease and vessel disease elsewhere in your body.  If your cholesterol is found to be high, or if you have heart disease or certain other medical conditions, then you may need to have your cholesterol monitored frequently and be treated with medication.   Ask if you should have a cardiac stress test if your history suggests this. A stress test is a test done on a treadmill that looks for heart disease. This test can find disease prior to there being a problem.  Menopause can be associated with physical symptoms and risks. Hormone replacement therapy is available to decrease these. You should talk to your caregiver about whether starting or continuing to take hormones is right for you.   Osteoporosis is a disease in which the bones lose minerals and strength as we age. This can result in serious bone fractures. Risk of osteoporosis can be identified using a bone density scan.  Women ages 31 and over should discuss this with their caregivers, as should women after menopause who have other risk factors. Ask your caregiver whether you should be taking a calcium supplement and Vitamin D, to reduce the rate of osteoporosis.   Avoid drinking alcohol in excess (more than two drinks per day).  Avoid use of street drugs. Do not share needles with anyone. Ask for professional help if you need assistance or instructions on stopping the use of alcohol, cigarettes, and/or drugs.  Brush your teeth twice a day with fluoride toothpaste, and floss once a day. Good oral hygiene prevents tooth decay and gum disease. The problems can be painful, unattractive, and can cause other health problems. Visit your dentist for a routine oral and dental check up and preventive care every 6-12 months.   Look at your skin regularly.  Use a mirror to look at your back. Notify your caregivers of changes in moles, especially if there are changes in shapes, colors, a size larger than a pencil eraser, an irregular border, or development of new moles.  Safety:  Use  seatbelts 100% of the time, whether driving or as a passenger.  Use safety devices such as hearing protection if you work in environments with loud noise or significant background noise.  Use safety glasses when doing any work that could send debris in to the eyes.  Use a helmet if you ride a bike or motorcycle.  Use appropriate safety gear for contact sports.  Talk to your caregiver about gun safety.  Use sunscreen with a SPF (or skin protection factor) of 15 or greater.  Lighter skinned people are at a greater risk of skin cancer. Dont forget to also wear sunglasses in order to protect your eyes from too much damaging sunlight. Damaging sunlight can accelerate cataract formation.   Practice safe sex. Use condoms. Condoms are used for birth control and to help reduce the spread of sexually transmitted infections (or STIs).  Some of the STIs are  gonorrhea (the clap), chlamydia, syphilis, trichomonas, herpes, HPV (human papilloma virus) and HIV (human immunodeficiency virus) which causes AIDS. The herpes, HIV and HPV are viral illnesses that have no cure. These can result in disability, cancer and death.   Keep carbon monoxide and smoke detectors in your home functioning at all times. Change the batteries every 6 months or use a model that plugs into the wall.     Osteopenia  Osteopenia is a loss of thickness (density) inside of the bones. Another name for osteopenia is low bone mass. Mild osteopenia is a normal part of aging. It is not a disease, and it does not cause symptoms. However, if you have osteopenia and continue to lose bone mass, you could develop a condition that causes the bones to become thin and break more easily (osteoporosis). You may also lose some height, have back pain, and have a stooped posture. Although osteopenia is not a disease, making changes to your lifestyle and diet can help to prevent osteopenia from developing into osteoporosis. What are the causes? Osteopenia is caused by loss of calcium in the bones.  Bones are constantly changing. Old bone cells are continually being replaced with new bone cells. This process builds new bone. The mineral calcium is needed to build new bone and maintain bone density. Bone density is usually highest around age 57. After that, most people's bodies cannot replace all the bone they have lost with new bone. What increases the risk? You are more likely to develop this condition if:  You are older than age 33.  You are a woman who went through menopause early.  You have a long illness that keeps you in bed.  You do not get enough exercise.  You lack certain nutrients (malnutrition).  You have an overactive thyroid gland (hyperthyroidism).  You smoke.  You drink a lot of alcohol.  You are taking medicines that weaken the bones, such as steroids. What are the signs  or symptoms? This condition does not cause any symptoms. You may have a slightly higher risk for bone breaks (fractures), so getting fractures more easily than normal may be an indication of osteopenia. How is this diagnosed? Your health care provider can diagnose this condition with a special type of X-ray exam that measures bone density (dual-energy X-ray absorptiometry, DEXA). This test can measure bone density in your hips, spine, and wrists. Osteopenia has no symptoms, so this condition is usually diagnosed after a routine bone density screening test is done for osteoporosis. This routine screening is usually done for:  Women who are age 71 or  older.  Men who are age 64 or older. If you have risk factors for osteopenia, you may have the screening test at an earlier age. How is this treated? Making dietary and lifestyle changes can lower your risk for osteoporosis. If you have severe osteopenia that is close to becoming osteoporosis, your health care provider may prescribe medicines and dietary supplements such as calcium and vitamin D. These supplements help to rebuild bone density. Follow these instructions at home:   Take over-the-counter and prescription medicines only as told by your health care provider. These include vitamins and supplements.  Eat a diet that is high in calcium and vitamin D. ? Calcium is found in dairy products, beans, salmon, and leafy green vegetables like spinach and broccoli. ? Look for foods that have vitamin D and calcium added to them (fortified foods), such as orange juice, cereal, and bread.  Do 30 or more minutes of a weight-bearing exercise every day, such as walking, jogging, or playing a sport. These types of exercises strengthen the bones.  Take precautions at home to lower your risk of falling, such as: ? Keeping rooms well-lit and free of clutter, such as cords. ? Installing safety rails on stairs. ? Using rubber mats in the bathroom or other  areas that are often wet or slippery.  Do not use any products that contain nicotine or tobacco, such as cigarettes and e-cigarettes. If you need help quitting, ask your health care provider.  Avoid alcohol or limit alcohol intake to no more than 1 drink a day for nonpregnant women and 2 drinks a day for men. One drink equals 12 oz of beer, 5 oz of wine, or 1 oz of hard liquor.  Keep all follow-up visits as told by your health care provider. This is important. Contact a health care provider if:  You have not had a bone density screening for osteoporosis and you are: ? A woman, age 83 or older. ? A man, age 59 or older.  You are a postmenopausal woman who has not had a bone density screening for osteoporosis.  You are older than age 80 and you want to know if you should have bone density screening for osteoporosis. Summary  Osteopenia is a loss of thickness (density) inside of the bones. Another name for osteopenia is low bone mass.  Osteopenia is not a disease, but it may increase your risk for a condition that causes the bones to become thin and break more easily (osteoporosis).  You may be at risk for osteopenia if you are older than age 54 or if you are a woman who went through early menopause.  Osteopenia does not cause any symptoms, but it can be diagnosed with a bone density screening test.  Dietary and lifestyle changes are the first treatment for osteopenia. These may lower your risk for osteoporosis. This information is not intended to replace advice given to you by your health care provider. Make sure you discuss any questions you have with your health care provider. Document Released: 08/08/2017 Document Revised: 10/12/2017 Document Reviewed: 08/08/2017 Elsevier Patient Education  2020 [place] Directive  Advance directives are legal documents that let you make choices ahead of time about your health care and medical treatment in case you become unable  to communicate for yourself. Advance directives are a way for you to communicate your wishes to family, friends, and health care providers. This can help convey your decisions about end-of-life care if you become  unable to communicate. Discussing and writing advance directives should happen over time rather than all at once. Advance directives can be changed depending on your situation and what you want, even after you have signed the advance directives. If you do not have an advance directive, some states assign family decision makers to act on your behalf based on how closely you are related to them. Each state has its own laws regarding advance directives. You may want to check with your health care provider, attorney, or state representative about the laws in your state. There are different types of advance directives, such as:  Medical power of attorney.  Living will.  Do not resuscitate (DNR) or do not attempt resuscitation (DNAR) order. Health care proxy and medical power of attorney A health care proxy, also called a health care agent, is a person who is appointed to make medical decisions for you in cases in which you are unable to make the decisions yourself. Generally, people choose someone they know well and trust to represent their preferences. Make sure to ask this person for an agreement to act as your proxy. A proxy may have to exercise judgment in the event of a medical decision for which your wishes are not known. A medical power of attorney is a legal document that names your health care proxy. Depending on the laws in your state, after the document is written, it may also need to be:  Signed.  Notarized.  Dated.  Copied.  Witnessed.  Incorporated into your medical record. You may also want to appoint someone to manage your financial affairs in a situation in which you are unable to do so. This is called a durable power of attorney for finances. It is a separate legal  document from the durable power of attorney for health care. You may choose the same person or someone different from your health care proxy to act as your agent in financial matters. If you do not appoint a proxy, or if there is a concern that the proxy is not acting in your best interests, a court-appointed guardian may be designated to act on your behalf. Living will A living will is a set of instructions documenting your wishes about medical care when you cannot express them yourself. Health care providers should keep a copy of your living will in your medical record. You may want to give a copy to family members or friends. To alert caregivers in case of an emergency, you can place a card in your wallet to let them know that you have a living will and where they can find it. A living will is used if you become:  Terminally ill.  Incapacitated.  Unable to communicate or make decisions. Items to consider in your living will include:  The use or non-use of life-sustaining equipment, such as dialysis machines and breathing machines (ventilators).  A DNR or DNAR order, which is the instruction not to use cardiopulmonary resuscitation (CPR) if breathing or heartbeat stops.  The use or non-use of tube feeding.  Withholding of food and fluids.  Comfort (palliative) care when the goal becomes comfort rather than a cure.  Organ and tissue donation. A living will does not give instructions for distributing your money and property if you should pass away. It is recommended that you seek the advice of a lawyer when writing a will. Decisions about taxes, beneficiaries, and asset distribution will be legally binding. This process can relieve your family and friends of any  concerns surrounding disputes or questions that may come up about the distribution of your assets. DNR or DNAR A DNR or DNAR order is a request not to have CPR in the event that your heart stops beating or you stop breathing. If a  DNR or DNAR order has not been made and shared, a health care provider will try to help any patient whose heart has stopped or who has stopped breathing. If you plan to have surgery, talk with your health care provider about how your DNR or DNAR order will be followed if problems occur. Summary  Advance directives are the legal documents that allow you to make choices ahead of time about your health care and medical treatment in case you become unable to communicate for yourself.  The process of discussing and writing advance directives should happen over time. You can change the advance directives, even after you have signed them.  Advance directives include DNR or DNAR orders, living wills, and designating an agent as your medical power of attorney. This information is not intended to replace advice given to you by your health care provider. Make sure you discuss any questions you have with your health care provider. Document Released: 02/06/2008 Document Revised: 12/04/2018 Document Reviewed: 09/18/2016 Elsevier Patient Education  2020 [employer].

## 2019-10-07 NOTE — Progress Notes (Signed)
Subjective:    [name] is a 68 y.o. female who presents for Preventative Services visit and chronic medical problems/med check visit.    Primary Care Provider Tysinger, Camelia Eng, PA-C here for primary care  Current Health Care Team:  Prior with Dr. Nancy Marus, gynecology oncology  Prior with Dr. Gery Pray, radiology oncology  Medical Services you may have received from other than [facility] providers in the past year (date may be approximate) none   Exercise Current exercise habits: WALK 7 DAYS A WEEK   Nutrition/Diet Current diet: well balanced  Depression Screen Depression screen [facility] 2/9 10/07/2019  Decreased Interest 0  Down, Depressed, Hopeless 0  PHQ - 2 Score 0    Activities of Daily Living Screen/Functional Status Survey Is the patient deaf or have difficulty hearing?: No Does the patient have difficulty seeing, even when wearing glasses/contacts?: No Does the patient have difficulty concentrating, remembering, or making decisions?: No Does the patient have difficulty walking or climbing stairs?: No(twisted knee 1 month ago) Does the patient have difficulty dressing or bathing?: No Does the patient have difficulty doing errands alone such as visiting a doctor's office or shopping?: No  Can patient draw a clock face showing 3:15 oclock, yes  Fall Risk Screen Fall Risk  10/07/2019 09/04/2019  Falls in the past year? 0 0    Gait Assessment: Normal gait observed - yes  Advanced directives Does patient have a [place]? No Does patient have a Living Will? No  Past Medical History:  Diagnosis Date  . Allergy   . Arthritis   . Blindness of left eye    partial blindness  . Endometrial adenocarcinoma (HCC)    Stage IB grade 2  . History of brachytherapy Aug. 8, 14, 17, 29 Sept. 4   Intracavitary/Iridium 192  (3000 cGy)  . History of radiation therapy 06/20/13/17/29/13&07/17/12   HDR endometrial adenocarcinoma  . Hypertension  08/2019  . Obesity   . Retinal detachment     Past Surgical History:  Procedure Laterality Date  . ABDOMINAL HYSTERECTOMY  05/09/11   Robotic TAH, BSO, and LND  . WISDOM TOOTH EXTRACTION  1973    Social History   Socioeconomic History  . Marital status: Single    Spouse name: Not on file  . Number of children: Not on file  . Years of education: Not on file  . Highest education level: Not on file  Occupational History  . Not on file  Social Needs  . Financial resource strain: Not on file  . Food insecurity    Worry: Not on file    Inability: Not on file  . Transportation needs    Medical: Not on file    Non-medical: Not on file  Tobacco Use  . Smoking status: Former Smoker    Packs/day: 0.50    Years: 30.00    Pack years: 15.00    Types: Cigarettes    Quit date: 01/12/2011    Years since quitting: 8.7  . Smokeless tobacco: Never Used  Substance and Sexual Activity  . Alcohol use: Yes    Alcohol/week: 3.0 standard drinks    Types: 1 Glasses of wine, 1 Cans of beer, 1 Shots of liquor per week    Comment: rarely  . Drug use: No  . Sexual activity: Never  Lifestyle  . Physical activity    Days per week: Not on file    Minutes per session: Not on file  . Stress:  Not on file  Relationships  . Social Herbalist on phone: Not on file    Gets together: Not on file    Attends religious service: Not on file    Active member of club or organization: Not on file    Attends meetings of clubs or organizations: Not on file    Relationship status: Not on file  . Intimate partner violence    Fear of current or ex partner: Not on file    Emotionally abused: Not on file    Physically abused: Not on file    Forced sexual activity: Not on file  Other Topics Concern  . Not on file  Social History Narrative   Lives alone.   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   08/2019    Family History  Problem Relation Age of Onset  . Colon cancer Mother   . Cancer  Mother        colon  . Aneurysm Father   . Heart disease Father 40       MI vs aneurysm  . Colon cancer Brother   . Kidney disease Brother   . Kidney failure Other   . Cancer Brother        colon     Current Outpatient Medications:  .  atenolol-chlorthalidone (TENORETIC) 50-25 MG tablet, Take 1 tablet by mouth daily., Disp: 30 tablet, Rfl: 1 .  azithromycin (ZITHROMAX) 250 MG tablet, 2 tablets day 1, then 1 tablet days 2-4 (Patient not taking: Reported on 09/10/2019), Disp: 6 tablet, Rfl: 0 .  ipratropium (ATROVENT) 0.06 % nasal spray, Place 2 sprays into both nostrils 3 (three) times daily as needed for rhinitis. (Patient not taking: Reported on 09/10/2019), Disp: 15 mL, Rfl: 12 .  Loratadine (CLARITIN PO), Take by mouth as needed. , Disp: , Rfl:   No Known Allergies  History reviewed: allergies, current medications, past family history, past medical history, past social history, past surgical history and problem list  Chronic issues discussed: HTN - compliant with medication  Acute issues discussed: Recent left knee pain that has now resolved  No injury or trauma    Objective:      Biometrics BP 136/80   Pulse 74   Temp (!) 96.4 F (35.8 C)   Ht 5\' 10"  (1.778 m)   Wt 225 lb 9.6 oz (102.3 kg)   SpO2 97%   BMI 32.37 kg/m    Wt Readings from Last 3 Encounters:  10/07/19 225 lb 9.6 oz (102.3 kg)  09/10/19 233 lb 6.4 oz (105.9 kg)  09/04/19 240 lb 6.4 oz (109 kg)   BP Readings from Last 3 Encounters:  10/07/19 136/80  09/10/19 (!) 146/88  09/04/19 (!) 180/116     Cognitive Testing  Alert? Yes  Normal Appearance?Yes  Oriented to person? Yes  Place? Yes   Time? Yes  Recall of three objects?  Yes  Can perform simple calculations? Yes  Displays appropriate judgment?Yes  Can read the correct time from a watch face?Yes  General appearance: alert, no distress, WD/WN, white female  Nutritional Status: Inadequate calore intake? no Loss of muscle mass? no  Loss of fat beneath skin? no Localized or general edema? no Diminished functional status? no  Other pertinent exam: Skin: scattered small 2-3 mm roundish crusted and pink/red lesions suggestive of AKs, small red round benign cherry hemangiomas of lower chest and upper abdomen, few other scattered macules HEENT: normocephalic, sclerae anicteric, TMs pearly, nares patent,  no discharge or erythema, pharynx normal Oral cavity: MMM, no lesions Neck: supple, no lymphadenopathy, no thyromegaly, no masses, no bruits Heart: RRR, normal S1, S2, no murmurs Lungs: CTA bilaterally, no wheezes, rhonchi, or rales Abdomen: +bs, soft, non tender, non distended, no masses, no hepatomegaly, no splenomegaly Musculoskeletal: left knee nontender, no swelling, no deformity, no laxity, otherwise nontender, no swelling, no obvious deformity Extremities: no edema, no cyanosis, no clubbing Pulses: 2+ symmetric, upper and lower extremities, normal cap refill Neurological: alert, oriented x 3, CN2-12 intact, strength normal upper extremities and lower extremities, sensation normal throughout, DTRs 2+ throughout, no cerebellar signs, gait normal Psychiatric: normal affect, behavior normal, pleasant  Breast, pelvic, rectal - declined/deferred today at her request   Assessment:   Encounter Diagnoses  Name Primary?  . Encounter for health maintenance examination in adult Yes  . Medicare annual wellness visit, initial   . Polyp of colon, unspecified part of colon, unspecified type   . Post-menopausal   . Estrogen deficiency   . History of endometrial cancer   . Former smoker   . History of radiation therapy   . History of brachytherapy   . Screen for colon cancer   . Osteopenia, unspecified location   . Encounter for hepatitis C screening test for low risk patient   . Left knee pain, unspecified chronicity   . Advanced directives, counseling/discussion   . Screening for heart disease   . Screening for lipid  disorders   . Screening for diabetes mellitus   . Vaccine counseling   . Post-nasal drip   . Need for pneumococcal vaccination      Plan:   A preventative services visit was completed today.  During the course of the visit today, we discussed and counseled about appropriate screening and preventive services.  A health risk assessment was established today that included a review of current medications, allergies, social history, family history, medical and preventative health history, biometrics, and preventative screenings to identify potential safety concerns or impairments.  A personalized plan was printed today for your records and use.   Personalized health advice and education was given today to reduce health risks and promote self management and wellness.  Information regarding end of life planning was discussed today.    Cancer screening Colon cancer screening:  Follow up as planned in January with Dr. Paulita Fujita from our recent referral  Breast cancer screening -  You are up to date from your recent 09/2019 mammogram  Return at your convenience for breast and pelvic exam    Vaccines: Check insurance coverage for Tetanus and Shingles vaccines  Up to date on flu shot  Counseled on the pneumococcal vaccine.  Vaccine information sheet given.  Pneumococcal vaccine Prevnar 13 given after consent obtained.     We will check baseline labs today   Consider baseline cardiology consult for heart disease screening.  We can refer if you are agreeable.   Continue your blood pressure medication  HTN - c/t current medication  Osteopenia - discussed exercise, vit D and calcium intake, reviewed 09/2019 bone density results  Left knee pain resolved     Recommendations:  I recommend a yearly ophthalmology/optometry visit for glaucoma screening and eye checkup  I recommended a yearly dental visit for hygiene and checkup  Advanced directives - discussed nature and purpose of  Advanced Directives, encouraged them to complete them if they have not done so and/or encouraged them to get [place] a copy if they have done this  already.    Risa was seen today for medicare wellness.  Diagnoses and all orders for this visit:  Encounter for health maintenance examination in adult -     Comprehensive metabolic panel -     TSH regular -     Hemoglobin A1c -     Lipid Panel -     Hepatitis C antibody -     Vitamin D, 25-hydroxy  Medicare annual wellness visit, initial  Polyp of colon, unspecified part of colon, unspecified type  Post-menopausal  Estrogen deficiency  History of endometrial cancer  Former smoker  History of radiation therapy  History of brachytherapy  Screen for colon cancer  Osteopenia, unspecified location  Encounter for hepatitis C screening test for low risk patient -     Hepatitis C antibody  Left knee pain, unspecified chronicity  Advanced directives, counseling/discussion  Screening for heart disease  Screening for lipid disorders -     Lipid Panel  Screening for diabetes mellitus -     Hemoglobin A1c  Vaccine counseling -     Pneumococcal conjugate vaccine 13-valent  Post-nasal drip  Need for pneumococcal vaccination     Medicare Attestation A preventative services visit was completed today.  During the course of the visit the patient was educated and counseled about appropriate screening and preventive services.  A health risk assessment was established with the patient that included a review of current medications, allergies, social history, family history, medical and preventative health history, biometrics, and preventative screenings to identify potential safety concerns or impairments.  A personalized plan was printed today for the patient's records and use.   Personalized health advice and education was given today to reduce health risks and promote self management and wellness.  Information regarding end of  life planning was discussed today.  Dorothea Ogle, PA-C   10/07/2019

## 2019-10-08 ENCOUNTER — Encounter: Payer: Self-pay | Admitting: Medical

## 2019-10-08 ENCOUNTER — Other Ambulatory Visit: Payer: Self-pay | Admitting: Medical

## 2019-10-08 LAB — COMPREHENSIVE METABOLIC PANEL
ALT: 25 IU/L (ref 0–32)
AST: 24 IU/L (ref 0–40)
Albumin/Globulin Ratio: 2.8 — ABNORMAL HIGH (ref 1.2–2.2)
Albumin: 4.7 g/dL (ref 3.8–4.8)
Alkaline Phosphatase: 81 IU/L (ref 39–117)
BUN/Creatinine Ratio: 14 (ref 12–28)
BUN: 15 mg/dL (ref 8–27)
Bilirubin Total: 0.5 mg/dL (ref 0.0–1.2)
CO2: 26 mmol/L (ref 20–29)
Calcium: 10.2 mg/dL (ref 8.7–10.3)
Chloride: 100 mmol/L (ref 96–106)
Creatinine, Ser: 1.11 mg/dL — ABNORMAL HIGH (ref 0.57–1.00)
GFR calc Af Amer: 60 mL/min/{1.73_m2} (ref >59)
GFR calc non Af Amer: 52 mL/min/{1.73_m2} — ABNORMAL LOW (ref >59)
Globulin, Total: 1.7 g/dL (ref 1.5–4.5)
Glucose: 125 mg/dL — ABNORMAL HIGH (ref 65–99)
Potassium: 3.5 mmol/L (ref 3.5–5.2)
Sodium: 143 mmol/L (ref 134–144)
Total Protein: 6.4 g/dL (ref 6.0–8.5)

## 2019-10-08 LAB — LIPID PANEL
Chol/HDL Ratio: 4.6 ratio — ABNORMAL HIGH (ref 0.0–4.4)
Cholesterol, Total: 174 mg/dL (ref 100–199)
HDL: 38 mg/dL — ABNORMAL LOW (ref >39)
LDL Chol Calc (NIH): 107 mg/dL — ABNORMAL HIGH (ref 0–99)
Triglycerides: 165 mg/dL — ABNORMAL HIGH (ref 0–149)
VLDL Cholesterol Cal: 29 mg/dL (ref 5–40)

## 2019-10-08 LAB — HEMOGLOBIN A1C
Est. average glucose Bld gHb Est-mCnc: 111 mg/dL
Hgb A1c MFr Bld: 5.5 % (ref 4.8–5.6)

## 2019-10-08 LAB — VITAMIN D 25 HYDROXY (VIT D DEFICIENCY, FRACTURES): Vit D, 25-Hydroxy: 40.9 ng/mL (ref 30.0–100.0)

## 2019-10-08 LAB — HEPATITIS C ANTIBODY: Hep C Virus Ab: <0.1 s/co ratio (ref 0.0–0.9)

## 2019-10-08 LAB — TSH: TSH: 1.89 u[IU]/mL (ref 0.450–4.500)

## 2019-10-08 MED ORDER — VITAMIN D 25 MCG (1000 UNIT) PO TABS: 1000.0000 [IU] | ORAL_TABLET | Freq: Every day | ORAL | 3 refills | Status: DC

## 2019-10-08 MED ORDER — ATENOLOL 50 MG PO TABS: 50.0000 mg | ORAL_TABLET | Freq: Every day | ORAL | 1 refills | Status: DC

## 2019-10-08 MED ORDER — ROSUVASTATIN CALCIUM 10 MG PO TABS: 10.0000 mg | ORAL_TABLET | Freq: Every day | ORAL | 3 refills | Status: DC

## 2019-10-08 MED ORDER — QUINAPRIL HCL 5 MG PO TABS: 5.0000 mg | ORAL_TABLET | Freq: Every day | ORAL | 2 refills | Status: DC

## 2019-10-13 ENCOUNTER — Ambulatory Visit: Payer: Medicare Other | Admitting: Medical

## 2019-10-13 ENCOUNTER — Other Ambulatory Visit: Payer: Self-pay

## 2019-10-13 ENCOUNTER — Encounter: Payer: Self-pay | Admitting: Medical

## 2019-10-13 VITALS — Temp 96.4°F | Ht 70.0 in | Wt 224.6 lb

## 2019-10-13 DIAGNOSIS — I10 Essential (primary) hypertension: Secondary | ICD-10-CM | POA: Diagnosis not present

## 2019-10-13 DIAGNOSIS — R42 Dizziness and giddiness: Secondary | ICD-10-CM | POA: Diagnosis not present

## 2019-10-13 DIAGNOSIS — R031 Nonspecific low blood-pressure reading: Secondary | ICD-10-CM | POA: Diagnosis not present

## 2019-10-13 NOTE — Progress Notes (Signed)
Subjective: Chief Complaint  Patient presents with  . Hypotension   Here for concerns about BP.  I just saw her last week for a physical.  Her creatinine had bumped up since taking atenolol chlorthalidone.  She just established care with [place] on September 04, 2019 with quite high blood pressure and tachycardia.  Prior to establishing care with this she had not really been taking an active role in her health for quite a while was not exercising had a poor diet.  Since starting the blood pressure pill and since they are establishing care with her she is really been motivated to get her health under better control, been exercising and eating really healthy.  She has lost down from 240 pounds down to 224 pounds currently  Just last week we had changed her from atenolol chlorthalidone to plain atenolol same dose 50 mg but we added quinapril 5 mg daily.  She did not have a blood pressure monitor at home until last week.    Her main concern today is a few episodes of dizziness.  She has been eating very healthy up until Thanksgiving.  She did indulge on Thanksgiving day at lunch.  She was at her brother's house.  After eating lunch about an hour later she went to stand up and felt dizzy.  She went and sat back down.  They checked her blood pressure and it read 86/60.  She had just stopped the chlorthalidone atenolol the day before and changed to the atenolol plus quinapril at that morning.  An hour and a half after that reading her blood pressure is 102/63.  Later that night about 10 PM when she got back on it was 124/66.  She only felt dizzy for that short period of time after the initial low reading when she tried to stand up.  Since that time she has been eating saltier food to try to bring up the blood pressure.  She called the after-hours line the next day but her pressure readings were all either normal or in the 130/80 range.    Since getting on track with healthier diet and exercise in October after our  initial visit she has been limiting her salt intake to around 500 mg up until Thanksgiving day.  She had not been feeling bad up until Thanksgiving, and is only had a few episodes of dizziness since that low reading on Thanksgiving day.  No other aggravating or relieving factors. No other complaint.    Past Medical History:  Diagnosis Date  . Allergy   . Arthritis   . Blindness of left eye    partial blindness  . Endometrial adenocarcinoma (HCC)    Stage IB grade 2  . History of brachytherapy Aug. 8, 14, 17, 29 Sept. 4   Intracavitary/Iridium 192  (3000 cGy)  . History of radiation therapy 06/20/13/17/29/13&07/17/12   HDR endometrial adenocarcinoma  . Hypertension 08/2019  . Obesity   . Retinal detachment    Current Outpatient Medications on File Prior to Visit  Medication Sig Dispense Refill  . atenolol (TENORMIN) 50 MG tablet Take 1 tablet (50 mg total) by mouth daily. 90 tablet 1  . cholecalciferol (VITAMIN D3) 25 MCG (1000 UT) tablet Take 1 tablet (1,000 Units total) by mouth daily. 90 tablet 3  . quinapril (ACCUPRIL) 5 MG tablet Take 1 tablet (5 mg total) by mouth daily. 30 tablet 2  . atenolol-chlorthalidone (TENORETIC) 50-25 MG tablet Take 1 tablet by mouth daily. (Patient not taking: Reported  on 10/13/2019) 30 tablet 1  . ipratropium (ATROVENT) 0.06 % nasal spray Place 2 sprays into both nostrils 3 (three) times daily as needed for rhinitis. (Patient not taking: Reported on 10/13/2019) 15 mL 12  . Loratadine (CLARITIN PO) Take by mouth as needed.     . rosuvastatin (CRESTOR) 10 MG tablet Take 1 tablet (10 mg total) by mouth at bedtime. (Patient not taking: Reported on 10/13/2019) 90 tablet 3   No current facility-administered medications on file prior to visit.    ROS as in subjective    Objective: Temp (!) 96.4 F (35.8 C)   Ht 5\' 10"  (1.778 m)   Wt 224 lb 9.6 oz (101.9 kg)   SpO2 96%   BMI 32.23 kg/m   Wt Readings from Last 3 Encounters:  10/13/19 224 lb 9.6 oz  (101.9 kg)  10/07/19 225 lb 9.6 oz (102.3 kg)  09/10/19 233 lb 6.4 oz (105.9 kg)   General appearance: alert, no distress, WD/WN,  Neck: supple, no lymphadenopathy, no thyromegaly, no masses, no JVD Heart: RRR, normal S1, S2, no murmurs Lungs: CTA bilaterally, no wheezes, rhonchi, or rales Pulses: 2+ symmetric, upper and lower extremities, normal cap refill Ext: no edema Neuro: nonfocal exam    Assessment: Encounter Diagnoses  Name Primary?  . Essential hypertension, benign Yes  . Low blood pressure reading   . Dizziness       Plan: I believe her recent symptoms are combination of factors.  At her initial visit back in October she had not been exercising, had no diet discretion, was found to have quite high blood pressure readings and a little tachycardia.  She has taken a very active role in changing her diet, exercising, and has lost significant weight in the short period of time I have known her over the last month.   Thus the combination of weight loss, medication compliance, salt reduction have all help get her pressures under better control as well as pulse.  The low reading on this past week may have been vasovagal.   However she did eat with some indulging on Thanksgiving day with heavy salt intake.  We also discussed the potential adverse effects of her medications including bradycardia with beta blocker.    She hold quinapril for now although we just started this.   She will use the recommendations below.     Patient Instructions  Recommendations: Continue Atenolol 50mg  daily HOLD the Quinapril 5mg  new medication this week Monitor BP and pulse daily or every other day this week.   Write these numbers down and call back Friday with updates. Goal BP is 120/70 - 130/80 range Hypotension would be <105/65 range with associated dizziness/lightheadedness Normal pulse is 70-100  We will continue to monitor this as well   As we move forward, we may modify both the Atenolol  and possibly add back Quinapril.     Continue to drink plenty of water daily  Don't add salt to your food, but try and limit sodium to 2g or less per day     I will await her call back Friday.

## 2019-10-13 NOTE — Patient Instructions (Signed)
Recommendations: Continue Atenolol 50mg  daily HOLD the Quinapril 5mg  new medication this week Monitor BP and pulse daily or every other day this week.   Write these numbers down and call back Friday with updates. Goal BP is 120/70 - 130/80 range Hypotension would be <105/65 range with associated dizziness/lightheadedness Normal pulse is 70-100  We will continue to monitor this as well   As we move forward, we may modify both the Atenolol and possibly add back Quinapril.     Continue to drink plenty of water daily  Don't add salt to your food, but try and limit sodium to 2g or less per day

## 2019-10-30 ENCOUNTER — Other Ambulatory Visit: Payer: Self-pay | Admitting: Medical

## 2019-12-08 LAB — HM COLONOSCOPY

## 2019-12-23 ENCOUNTER — Encounter: Payer: Self-pay | Admitting: Medical

## 2020-01-03 ENCOUNTER — Other Ambulatory Visit: Payer: Self-pay | Admitting: Medical

## 2020-03-04 ENCOUNTER — Other Ambulatory Visit: Payer: Self-pay | Admitting: Medical

## 2020-03-04 MED ORDER — QUINAPRIL HCL 5 MG PO TABS: 5.0000 mg | ORAL_TABLET | Freq: Every day | ORAL | 3 refills | Status: DC

## 2020-03-26 ENCOUNTER — Other Ambulatory Visit: Payer: Self-pay | Admitting: Medical

## 2020-03-29 ENCOUNTER — Other Ambulatory Visit: Payer: Self-pay

## 2020-03-29 ENCOUNTER — Encounter: Payer: Self-pay | Admitting: Medical

## 2020-03-29 ENCOUNTER — Ambulatory Visit: Payer: Medicare PPO | Admitting: Medical

## 2020-03-29 VITALS — BP 150/98 | HR 56 | Temp 98.0°F | Ht 69.0 in | Wt 213.4 lb

## 2020-03-29 DIAGNOSIS — R031 Nonspecific low blood-pressure reading: Secondary | ICD-10-CM | POA: Diagnosis not present

## 2020-03-29 DIAGNOSIS — M8580 Other specified disorders of bone density and structure, unspecified site: Secondary | ICD-10-CM

## 2020-03-29 DIAGNOSIS — I10 Essential (primary) hypertension: Secondary | ICD-10-CM

## 2020-03-29 DIAGNOSIS — R7989 Other specified abnormal findings of blood chemistry: Secondary | ICD-10-CM | POA: Insufficient documentation

## 2020-03-29 DIAGNOSIS — Z8489 Family history of other specified conditions: Secondary | ICD-10-CM | POA: Insufficient documentation

## 2020-03-29 DIAGNOSIS — R42 Dizziness and giddiness: Secondary | ICD-10-CM | POA: Diagnosis not present

## 2020-03-29 DIAGNOSIS — E785 Hyperlipidemia, unspecified: Secondary | ICD-10-CM | POA: Insufficient documentation

## 2020-03-29 NOTE — Progress Notes (Signed)
Subjective: Chief Complaint  Patient presents with  . Follow-up    kidney function    Here for med check.  HTN - Checks BPs at home.  checking BPs April and May and numbers at home mostly mid 100s SBP, mostly 50-60 DBP.   Taking Atenolol 50mg  daily, Using Quinapril 2.5mg  daily, 1/2 tablet of the 5mg  daily.  Does periodically feel light headed with lower readings.    hyperlipidemia - Taking Crestor 10mg  daily at bedtime.  Lost weight since last visit intentionally.   Exercising with walking.  She found out brother malignant hyperthermia.   He can't have inhalation anesthesia.    No other c/o  Past Medical History:  Diagnosis Date  . Allergy   . Arthritis   . Blindness of left eye    partial blindness  . Endometrial adenocarcinoma (HCC)    Stage IB grade 2  . Family history of malignant hyperthermia    brother  . History of brachytherapy Aug. 8, 14, 17, 29 Sept. 4   Intracavitary/Iridium 192  (3000 cGy)  . History of radiation therapy 06/20/13/17/29/13&07/17/12   HDR endometrial adenocarcinoma  . Hypertension 08/2019  . Obesity   . Retinal detachment    Current Outpatient Medications on File Prior to Visit  Medication Sig Dispense Refill  . atenolol (TENORMIN) 50 MG tablet TAKE 1 TABLET BY MOUTH EVERY DAY 90 tablet 1  . rosuvastatin (CRESTOR) 10 MG tablet Take 1 tablet (10 mg total) by mouth at bedtime. 90 tablet 3  . cholecalciferol (VITAMIN D3) 25 MCG (1000 UT) tablet Take 1 tablet (1,000 Units total) by mouth daily. 90 tablet 3  . ipratropium (ATROVENT) 0.06 % nasal spray Place 2 sprays into both nostrils 3 (three) times daily as needed for rhinitis. (Patient not taking: Reported on 10/13/2019) 15 mL 12  . Loratadine (CLARITIN PO) Take by mouth as needed.      No current facility-administered medications on file prior to visit.    ROS as in subjective   Objective: BP (!) 150/98   Pulse (!) 56   Temp 98 F (36.7 C)   Ht 5\' 9"  (1.753 m)   Wt 213 lb 6.4 oz (96.8 kg)    SpO2 98%   BMI 31.51 kg/m   Wt Readings from Last 3 Encounters:  03/29/20 213 lb 6.4 oz (96.8 kg)  10/13/19 224 lb 9.6 oz (101.9 kg)  10/07/19 225 lb 9.6 oz (102.3 kg)   BP Readings from Last 3 Encounters:  03/29/20 (!) 150/98  10/07/19 136/80  09/10/19 (!) 146/88   General appearence: alert, no distress, WD/WN,  Neck: supple, no lymphadenopathy, no thyromegaly, no masses Heart: RRR, normal S1, S2, no murmurs Lungs: CTA bilaterally, no wheezes, rhonchi, or rales Pulses: 2+ symmetric, upper and lower extremities, normal cap refill Ext: no edema Neuro: nonfocal exam     Assessment: Encounter Diagnoses  Name Primary?  . Essential hypertension, benign Yes  . Low blood pressure reading   . Osteopenia, unspecified location   . Dizziness   . Hyperlipidemia, unspecified hyperlipidemia type   . Creatinine elevation   . Family history of malignant hyperthermia      Plan: Despite some white coat hypertension, home readings are a little low, some SBP <100, some DBP <60.   We will stop quinapril.  C/t atenolol.  Labs today  She will return with nurse for BP check with her cuff compared to ours.   hyperlipidemia - labs today since adding statin.  C/t efforts with diet , exercise, and congratulated her on her weight loss.  Family history of malignant hyperthemia in brother - she will consider whether she wants referral to geneticist.   [name] was seen today for follow-up.  Diagnoses and all orders for this visit:  Essential hypertension, benign -     Lipid panel -     Comprehensive metabolic panel  Low blood pressure reading -     Lipid panel -     Comprehensive metabolic panel  Osteopenia, unspecified location  Dizziness  Hyperlipidemia, unspecified hyperlipidemia type -     Lipid panel  Creatinine elevation -     Comprehensive metabolic panel -     CK  Family history of malignant hyperthermia

## 2020-03-30 ENCOUNTER — Other Ambulatory Visit: Payer: Medicare PPO

## 2020-03-30 LAB — LIPID PANEL
Chol/HDL Ratio: 2.6 ratio (ref 0.0–4.4)
Cholesterol, Total: 123 mg/dL (ref 100–199)
HDL: 47 mg/dL (ref >39)
LDL Chol Calc (NIH): 60 mg/dL (ref 0–99)
Triglycerides: 82 mg/dL (ref 0–149)
VLDL Cholesterol Cal: 16 mg/dL (ref 5–40)

## 2020-03-30 LAB — COMPREHENSIVE METABOLIC PANEL
ALT: 17 IU/L (ref 0–32)
AST: 20 IU/L (ref 0–40)
Albumin/Globulin Ratio: 2.3 — ABNORMAL HIGH (ref 1.2–2.2)
Albumin: 4.6 g/dL (ref 3.8–4.8)
Alkaline Phosphatase: 72 IU/L (ref 48–121)
BUN/Creatinine Ratio: 12 (ref 12–28)
BUN: 12 mg/dL (ref 8–27)
Bilirubin Total: 0.6 mg/dL (ref 0.0–1.2)
CO2: 21 mmol/L (ref 20–29)
Calcium: 9.6 mg/dL (ref 8.7–10.3)
Chloride: 105 mmol/L (ref 96–106)
Creatinine, Ser: 0.98 mg/dL (ref 0.57–1.00)
GFR calc Af Amer: 70 mL/min/{1.73_m2} (ref >59)
GFR calc non Af Amer: 60 mL/min/{1.73_m2} (ref >59)
Globulin, Total: 2 g/dL (ref 1.5–4.5)
Glucose: 100 mg/dL — ABNORMAL HIGH (ref 65–99)
Potassium: 4.8 mmol/L (ref 3.5–5.2)
Sodium: 142 mmol/L (ref 134–144)
Total Protein: 6.6 g/dL (ref 6.0–8.5)

## 2020-03-30 LAB — CK: Total CK: 110 U/L (ref 32–182)

## 2020-03-30 NOTE — Progress Notes (Unsigned)
Pt came in to check her bp and make sure she was putting on the cuff right. bp has been lower at home and came in today and had it read and it was 160/93. I advised pt to loosen the cuff alittle as it was squeezes tightly on arm and it was making a higher reading. Pt advised me that she was told to come off a bp med because her numbers were 80-110/40-60. I advised if she did start getting dizziness to check her bp and to eat something and follow-up if still getting low readings.

## 2020-09-15 ENCOUNTER — Other Ambulatory Visit: Payer: Self-pay | Admitting: Medical

## 2020-09-21 ENCOUNTER — Other Ambulatory Visit: Payer: Self-pay | Admitting: Medical

## 2020-09-21 NOTE — Telephone Encounter (Signed)
Go ahead and schedule fasting CPX and send in 1 round of med

## 2020-09-21 NOTE — Telephone Encounter (Signed)
Pt. Requesting refill on Rosuvastatin last apt was 03/29/20 last CPE was 10/07/19 and has no future apt.

## 2020-10-14 ENCOUNTER — Other Ambulatory Visit: Payer: Self-pay | Admitting: Medical

## 2020-11-07 ENCOUNTER — Other Ambulatory Visit: Payer: Self-pay | Admitting: Medical

## 2020-12-02 ENCOUNTER — Other Ambulatory Visit: Payer: Self-pay | Admitting: Medical

## 2020-12-08 ENCOUNTER — Other Ambulatory Visit: Payer: Self-pay | Admitting: Medical

## 2021-01-27 ENCOUNTER — Other Ambulatory Visit: Payer: Self-pay

## 2021-01-27 ENCOUNTER — Other Ambulatory Visit: Payer: Self-pay | Admitting: Medical

## 2021-01-27 ENCOUNTER — Ambulatory Visit: Payer: Medicare PPO | Admitting: Medical

## 2021-01-27 ENCOUNTER — Encounter: Payer: Self-pay | Admitting: Medical

## 2021-01-27 VITALS — BP 121/71 | HR 69 | Ht 69.0 in | Wt 235.4 lb

## 2021-01-27 DIAGNOSIS — Z800 Family history of malignant neoplasm of digestive organs: Secondary | ICD-10-CM

## 2021-01-27 DIAGNOSIS — I10 Essential (primary) hypertension: Secondary | ICD-10-CM | POA: Diagnosis not present

## 2021-01-27 DIAGNOSIS — E2839 Other primary ovarian failure: Secondary | ICD-10-CM

## 2021-01-27 DIAGNOSIS — Z8542 Personal history of malignant neoplasm of other parts of uterus: Secondary | ICD-10-CM

## 2021-01-27 DIAGNOSIS — L237 Allergic contact dermatitis due to plants, except food: Secondary | ICD-10-CM | POA: Insufficient documentation

## 2021-01-27 DIAGNOSIS — Z7185 Encounter for immunization safety counseling: Secondary | ICD-10-CM | POA: Diagnosis not present

## 2021-01-27 DIAGNOSIS — Z23 Encounter for immunization: Secondary | ICD-10-CM | POA: Diagnosis not present

## 2021-01-27 DIAGNOSIS — Z780 Asymptomatic menopausal state: Secondary | ICD-10-CM

## 2021-01-27 DIAGNOSIS — Z923 Personal history of irradiation: Secondary | ICD-10-CM

## 2021-01-27 DIAGNOSIS — Z0000 Encounter for general adult medical examination without abnormal findings: Secondary | ICD-10-CM

## 2021-01-27 DIAGNOSIS — Z7189 Other specified counseling: Secondary | ICD-10-CM

## 2021-01-27 DIAGNOSIS — R7301 Impaired fasting glucose: Secondary | ICD-10-CM | POA: Insufficient documentation

## 2021-01-27 DIAGNOSIS — E785 Hyperlipidemia, unspecified: Secondary | ICD-10-CM

## 2021-01-27 DIAGNOSIS — Z136 Encounter for screening for cardiovascular disorders: Secondary | ICD-10-CM | POA: Insufficient documentation

## 2021-01-27 DIAGNOSIS — Z87891 Personal history of nicotine dependence: Secondary | ICD-10-CM

## 2021-01-27 DIAGNOSIS — M8580 Other specified disorders of bone density and structure, unspecified site: Secondary | ICD-10-CM

## 2021-01-27 DIAGNOSIS — Z8489 Family history of other specified conditions: Secondary | ICD-10-CM

## 2021-01-27 MED ORDER — TRIAMCINOLONE ACETONIDE 0.1 % EX CREA: 1.0000 "application " | TOPICAL_CREAM | Freq: Two times a day (BID) | CUTANEOUS | 0 refills | Status: DC

## 2021-01-27 NOTE — Progress Notes (Signed)
Subjective:    [name] is a 70 y.o. female who presents for Preventative Services visit and chronic medical problems/med check visit.    Primary Care Provider Jermia Rigsby, Camelia Eng, PA-C here for primary care  Current Health Care Team:  Dentist, n/a  Eye doctor, [place] you may have received from other than [facility] providers in the past year (date may be approximate) none  Exercise Current exercise habits: Home exercise routine includes walking 1 day a week for 30 minutes.   Nutrition/Diet Current diet: well balanced  Depression Screen Depression screen [facility] 2/9 01/27/2021  Decreased Interest 0  Down, Depressed, Hopeless 0  PHQ - 2 Score 0    Activities of Daily Living Screen/Functional Status Survey Is the patient deaf or have difficulty hearing?: No Does the patient have difficulty seeing, even when wearing glasses/contacts?: No Does the patient have difficulty concentrating, remembering, or making decisions?: No Does the patient have difficulty walking or climbing stairs?: No Does the patient have difficulty dressing or bathing?: No Does the patient have difficulty doing errands alone such as visiting a doctor's office or shopping?: No  Can patient draw a clock face showing 3:15 oclock, yes  Fall Risk Screen Fall Risk  01/27/2021 10/07/2019 09/04/2019  Falls in the past year? 0 0 0  Risk for fall due to : No Fall Risks - -  Follow up Falls evaluation completed - -    Gait Assessment: Normal gait observed YES  Advanced directives Does patient have a [place]? No Does patient have a Living Will? No  Past Medical History:  Diagnosis Date  . Allergy   . Arthritis   . Blindness of left eye    partial blindness  . Endometrial adenocarcinoma (HCC)    Stage IB grade 2  . Family history of malignant hyperthermia    brother  . History of brachytherapy Aug. 8, 14, 17, 29 Sept. 4   Intracavitary/Iridium 192  (3000 cGy)   . History of radiation therapy 06/20/13/17/29/13&07/17/12   HDR endometrial adenocarcinoma  . Hyperlipidemia   . Hypertension 08/2019  . Obesity   . Osteopenia   . Retinal detachment     Past Surgical History:  Procedure Laterality Date  . ABDOMINAL HYSTERECTOMY  05/09/11   Robotic TAH, BSO, and LND  . WISDOM TOOTH EXTRACTION  1973    Social History   Socioeconomic History  . Marital status: Single    Spouse name: Not on file  . Number of children: Not on file  . Years of education: Not on file  . Highest education level: Not on file  Occupational History  . Not on file  Tobacco Use  . Smoking status: Former Smoker    Packs/day: 0.50    Years: 30.00    Pack years: 15.00    Types: Cigarettes    Quit date: 01/12/2011    Years since quitting: 10.0  . Smokeless tobacco: Never Used  Substance and Sexual Activity  . Alcohol use: Yes    Alcohol/week: 3.0 standard drinks    Types: 1 Glasses of wine, 1 Cans of beer, 1 Shots of liquor per week    Comment: rarely  . Drug use: No  . Sexual activity: Never  Other Topics Concern  . Not on file  Social History Narrative   Lives alone.   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   01/2021   Social Determinants of Health   Financial Resource Strain:  Not on file  Food Insecurity: Not on file  Transportation Needs: Not on file  Physical Activity: Not on file  Stress: Not on file  Social Connections: Not on file  Intimate Partner Violence: Not on file    Family History  Problem Relation Age of Onset  . Colon cancer Mother   . Cancer Mother        colon  . Aneurysm Father   . Heart disease Father 71       MI vs aneurysm  . Colon cancer Brother   . Kidney disease Brother   . Kidney failure Other   . Cancer Brother        colon     Current Outpatient Medications:  .  atenolol (TENORMIN) 50 MG tablet, TAKE 1 TABLET BY MOUTH EVERY DAY, Disp: 90 tablet, Rfl: 0 .  cholecalciferol (VITAMIN D3) 25 MCG (1000 UT) tablet,  Take 1 tablet (1,000 Units total) by mouth daily., Disp: 90 tablet, Rfl: 3 .  rosuvastatin (CRESTOR) 10 MG tablet, TAKE 1 TABLET BY MOUTH EVERYDAY AT BEDTIME, Disp: 30 tablet, Rfl: 0 .  triamcinolone (KENALOG) 0.1 %, Apply 1 application topically 2 (two) times daily., Disp: 80 g, Rfl: 0 .  Ascorbic Acid (VITAMIN C) 1000 MG tablet, , Disp: , Rfl:  .  Multiple Vitamins-Minerals (CENTRUM ADULTS) TABS, , Disp: , Rfl:   No Known Allergies  History reviewed: allergies, current medications, past family history, past medical history, past social history, past surgical history and problem list  Chronic issues discussed: HTN  - compliant with medication.  Home BPs 104/68 to high of 128/76.  averaging in the mid 115/low 70s.  Pulses range 59-73 at rest.    hyperlipidemia - compliant with medicaiton   Compliant with vit D supplement   Acute issues discussed: Recent bad bout of poison ivy doing yard work.   Using OTC remedy and its clearing up.  Was doing weeding and yard work recently     Objective:      Biometrics BP 121/71   Pulse 69   Ht 5\' 9"  (1.753 m)   Wt 235 lb 6.4 oz (106.8 kg)   SpO2 98%   BMI 34.76 kg/m   Wt Readings from Last 3 Encounters:  01/27/21 235 lb 6.4 oz (106.8 kg)  03/29/20 213 lb 6.4 oz (96.8 kg)  10/13/19 224 lb 9.6 oz (101.9 kg)   BP Readings from Last 3 Encounters:  01/27/21 121/71  03/29/20 (!) 150/98  10/07/19 136/80    Cognitive Testing  Alert? Yes  Normal Appearance?Yes  Oriented to person? Yes  Place? Yes   Time? Yes  Recall of three objects?  Yes  Can perform simple calculations? Yes  Displays appropriate judgment?Yes  Can read the correct time from a watch face?Yes  General appearance: alert, no distress, WD/WN, white female  Nutritional Status: Inadequate calore intake? no Loss of muscle mass? no Loss of fat beneath skin? no Localized or general edema? no Diminished functional status? no  Other pertinent exam: Skin: large  patches of red crusted roundish and linear urticarial lesions of hearling poison ivy dermatitis on both arms throughout, legs throughout, abdomen, no specific other worrisome lesions  HEENT: normocephalic, sclerae anicteric, TMs pearly, nares patent, no discharge or erythema, pharynx normal Oral cavity: MMM, no lesions Neck: supple, no lymphadenopathy, no thyromegaly, no masses, no bruits Heart: RRR, normal S1, S2, no murmurs Lungs: CTA bilaterally, no wheezes, rhonchi, or rales Abdomen: +bs, soft, non tender, non distended,  no masses, no hepatomegaly, no splenomegaly Musculoskeletal: nontender, no swelling, no obvious deformity Extremities: no edema, no cyanosis, no clubbing Pulses: 2+ symmetric, upper and lower extremities, normal cap refill Neurological: alert, oriented x 3, CN2-12 intact, strength normal upper extremities and lower extremities, sensation normal throughout, DTRs 2+ throughout, no cerebellar signs, gait normal Psychiatric: normal affect, behavior normal, pleasant    Breast: nontender, no masses or lumps, no skin changes, no nipple discharge or inversion, no axillary lymphadenopathy Gyn: Normal external genitalia without lesions, vagina with normal mucosa, cervix without lesions, no cervical motion tenderness, no abnormal vaginal discharge.  Uterus and adnexa not enlarged, nontender, no masses.  Pap performed.  Exam chaperoned by nurse. Rectal:     Assessment:   Encounter Diagnoses  Name Primary?  . Encounter for health maintenance examination in adult Yes  . Medicare annual wellness visit, subsequent   . Need for pneumococcal vaccination   . Advanced directives, counseling/discussion   . Essential hypertension, benign   . Estrogen deficiency   . Family history of malignant hyperthermia   . Former smoker   . History of endometrial cancer   . History of radiation therapy   . History of brachytherapy   . Hyperlipidemia, unspecified hyperlipidemia type   .  Osteopenia, unspecified location   . Post-menopausal   . Vaccine counseling   . Family history of colon cancer   . Screening for heart disease   . Impaired fasting blood sugar   . Poison ivy dermatitis      Plan:   A preventative services visit was completed today.  During the course of the visit today, we discussed and counseled about appropriate screening and preventive services.  A health risk assessment was established today that included a review of current medications, allergies, social history, family history, medical and preventative health history, biometrics, and preventative screenings to identify potential safety concerns or impairments.   Today you had a preventative care visit or wellness visit.    Topics today may have included healthy lifestyle, diet, exercise, preventative care, vaccinations, sick and well care, proper use of emergency dept and after hours care, as well as other concerns.     Recommendations: Continue to return yearly for your annual wellness and preventative care visits.  This gives [place] a chance to discuss healthy lifestyle, exercise, vaccinations, review your chart record, and perform screenings where appropriate.  I recommend you see your eye doctor yearly for routine vision care.  I recommend you see your dentist yearly for routine dental care including hygiene visits twice yearly.   Vaccination recommendations were reviewed Call your insurance to inquire about coverage for the tetanus diphtheria or Td vaccine, the shingles vaccine.  If they cover these, plan to get this done in the next few months.  Counseled on the pneumococcal vaccine.  Vaccine information sheet given.  Pneumococcal vaccine PPSV23 given after consent obtained.    Screening for cancer: Breast cancer screening: You should perform a self breast exam monthly.   We reviewed recommendations for regular mammograms and breast cancer screening.  Colon cancer screening:  i reviewed  your colonoscopy on file that is up to date from 11/2019 with Dr. Paulita Fujita  Skin cancer screening: Check your skin regularly for new changes, growing lesions, or other lesions of concern Come in for evaluation if you have skin lesions of concern.  Lung cancer screening: If you have a greater than 30 pack year history of tobacco use, then you qualify for lung cancer screening  with a chest CT scan  We currently don't have screenings for other cancers besides breast, cervical, colon, and lung cancers.  If you have a strong family history of cancer or have other cancer screening concerns, please let me know.    Bone health: Get at least 150 minutes of aerobic exercise weekly Get weight bearing exercise at least once weekly Bone density -I reviewed your November 2020 bone density test showing osteopenia I recommend you continue calcium and vitamin D supplementation You will be due for repeat bone density test November 2022   Heart health: Get at least 150 minutes of aerobic exercise weekly Limit alcohol It is important to maintain a healthy blood pressure and healthy cholesterol numbers  Referral to cardiology for screening    Separate significant issues discussed: HTN - home readings at goal.  C/t current medicaiton, referral to cardiology heart disease screening  hyperlipemia - continue statin/Crestor, routine labs today   Complete advanced directives and get [place] a copy    [name] was seen today for medicare wellness.  Diagnoses and all orders for this visit:  Encounter for health maintenance examination in adult -     DG Bone Density; Future -     Comprehensive metabolic panel -     CBC -     Lipid panel -     Hemoglobin A1c -     VITAMIN D 25 Hydroxy (Vit-D Deficiency, Fractures)  Medicare annual wellness visit, subsequent  Need for pneumococcal vaccination  Advanced directives, counseling/discussion  Essential hypertension, benign -     Ambulatory referral to  Cardiology  Estrogen deficiency -     DG Bone Density; Future -     VITAMIN D 25 Hydroxy (Vit-D Deficiency, Fractures)  Family history of malignant hyperthermia  Former smoker -     Ambulatory referral to Cardiology  History of endometrial cancer  History of radiation therapy  History of brachytherapy  Hyperlipidemia, unspecified hyperlipidemia type -     Lipid panel -     Ambulatory referral to Cardiology  Osteopenia, unspecified location -     DG Bone Density; Future -     VITAMIN D 25 Hydroxy (Vit-D Deficiency, Fractures)  Post-menopausal -     DG Bone Density; Future -     VITAMIN D 25 Hydroxy (Vit-D Deficiency, Fractures)  Vaccine counseling -     Pneumococcal polysaccharide vaccine 23-valent greater than or equal to 2yo subcutaneous/IM  Family history of colon cancer  Screening for heart disease -     Ambulatory referral to Cardiology  Impaired fasting blood sugar -     Hemoglobin A1c  Poison ivy dermatitis  Other orders -     triamcinolone (KENALOG) 0.1 %; Apply 1 application topically 2 (two) times daily.     Follow-up pending labs, yearly for physical    Medicare Attestation A preventative services visit was completed today.  During the course of the visit the patient was educated and counseled about appropriate screening and preventive services.  A health risk assessment was established with the patient that included a review of current medications, allergies, social history, family history, medical and preventative health history, biometrics, and preventative screenings to identify potential safety concerns or impairments.  A personalized plan was printed today for the patient's records and use.   Personalized health advice and education was given today to reduce health risks and promote self management and wellness.  Information regarding end of life planning was discussed today.  Dorothea Ogle, PA-C  01/27/2021    

## 2021-01-27 NOTE — Patient Instructions (Signed)
  Today you had a preventative care visit or wellness visit.    Topics today may have included healthy lifestyle, diet, exercise, preventative care, vaccinations, sick and well care, proper use of emergency dept and after hours care, as well as other concerns.     Recommendations: Continue to return yearly for your annual wellness and preventative care visits.  This gives [place] a chance to discuss healthy lifestyle, exercise, vaccinations, review your chart record, and perform screenings where appropriate.  I recommend you see your eye doctor yearly for routine vision care.  I recommend you see your dentist yearly for routine dental care including hygiene visits twice yearly.   Vaccination recommendations were reviewed Call your insurance to inquire about coverage for the tetanus diphtheria or Td vaccine, the shingles vaccine.  If they cover these, plan to get this done in the next few months.  Counseled on the pneumococcal vaccine.  Vaccine information sheet given.  Pneumococcal vaccine PPSV23 given after consent obtained.    Screening for cancer: Breast cancer screening: You should perform a self breast exam monthly.   We reviewed recommendations for regular mammograms and breast cancer screening.  Colon cancer screening:  i reviewed your colonoscopy on file that is up to date from 11/2019 with Dr. Paulita Fujita  Skin cancer screening: Check your skin regularly for new changes, growing lesions, or other lesions of concern Come in for evaluation if you have skin lesions of concern.  Lung cancer screening: If you have a greater than 30 pack year history of tobacco use, then you qualify for lung cancer screening with a chest CT scan  We currently don't have screenings for other cancers besides breast, cervical, colon, and lung cancers.  If you have a strong family history of cancer or have other cancer screening concerns, please let me know.    Bone health: Get at least 150 minutes of  aerobic exercise weekly Get weight bearing exercise at least once weekly Bone density -I reviewed your November 2020 bone density test showing osteopenia I recommend you continue calcium and vitamin D supplementation You will be due for repeat bone density test November 2022   Heart health: Get at least 150 minutes of aerobic exercise weekly Limit alcohol It is important to maintain a healthy blood pressure and healthy cholesterol numbers  Referral to cardiology for screening    Separate significant issues discussed: HTN - home readings at goal.  C/t current medicaiton, referral to cardiology heart disease screening  hyperlipemia - continue statin/Crestor, routine labs today   Complete advanced directives and get [place] a copy

## 2021-01-28 LAB — CBC
Hematocrit: 42.9 % (ref 34.0–46.6)
Hemoglobin: 14.5 g/dL (ref 11.1–15.9)
MCH: 30.3 pg (ref 26.6–33.0)
MCHC: 33.8 g/dL (ref 31.5–35.7)
MCV: 90 fL (ref 79–97)
Platelets: 224 10*3/uL (ref 150–450)
RBC: 4.78 x10E6/uL (ref 3.77–5.28)
RDW: 12.7 % (ref 11.7–15.4)
WBC: 7.3 10*3/uL (ref 3.4–10.8)

## 2021-01-28 LAB — COMPREHENSIVE METABOLIC PANEL
ALT: 16 IU/L (ref 0–32)
AST: 19 IU/L (ref 0–40)
Albumin/Globulin Ratio: 2.2 (ref 1.2–2.2)
Albumin: 4.7 g/dL (ref 3.8–4.8)
Alkaline Phosphatase: 70 IU/L (ref 44–121)
BUN/Creatinine Ratio: 12 (ref 12–28)
BUN: 12 mg/dL (ref 8–27)
Bilirubin Total: 0.5 mg/dL (ref 0.0–1.2)
CO2: 22 mmol/L (ref 20–29)
Calcium: 9.4 mg/dL (ref 8.7–10.3)
Chloride: 104 mmol/L (ref 96–106)
Creatinine, Ser: 0.99 mg/dL (ref 0.57–1.00)
Globulin, Total: 2.1 g/dL (ref 1.5–4.5)
Glucose: 95 mg/dL (ref 65–99)
Potassium: 4.3 mmol/L (ref 3.5–5.2)
Sodium: 143 mmol/L (ref 134–144)
Total Protein: 6.8 g/dL (ref 6.0–8.5)
eGFR: 62 mL/min/{1.73_m2} (ref >59)

## 2021-01-28 LAB — LIPID PANEL
Chol/HDL Ratio: 2.7 ratio (ref 0.0–4.4)
Cholesterol, Total: 125 mg/dL (ref 100–199)
HDL: 46 mg/dL (ref >39)
LDL Chol Calc (NIH): 62 mg/dL (ref 0–99)
Triglycerides: 89 mg/dL (ref 0–149)
VLDL Cholesterol Cal: 17 mg/dL (ref 5–40)

## 2021-01-28 LAB — HEMOGLOBIN A1C
Est. average glucose Bld gHb Est-mCnc: 108 mg/dL
Hgb A1c MFr Bld: 5.4 % (ref 4.8–5.6)

## 2021-01-28 LAB — VITAMIN D 25 HYDROXY (VIT D DEFICIENCY, FRACTURES): Vit D, 25-Hydroxy: 36.1 ng/mL (ref 30.0–100.0)

## 2021-01-31 ENCOUNTER — Other Ambulatory Visit: Payer: Self-pay | Admitting: Medical

## 2021-01-31 MED ORDER — ROSUVASTATIN CALCIUM 10 MG PO TABS: 10.0000 mg | ORAL_TABLET | Freq: Every day | ORAL | 3 refills | Status: DC

## 2021-01-31 MED ORDER — VITAMIN D 25 MCG (1000 UNIT) PO TABS: 1000.0000 [IU] | ORAL_TABLET | Freq: Every day | ORAL | 3 refills | Status: DC

## 2021-01-31 MED ORDER — ATENOLOL 50 MG PO TABS: 50.0000 mg | ORAL_TABLET | Freq: Every day | ORAL | 3 refills | Status: DC

## 2021-02-28 ENCOUNTER — Other Ambulatory Visit: Payer: Self-pay

## 2021-02-28 ENCOUNTER — Ambulatory Visit: Payer: Medicare PPO | Admitting: Cardiology

## 2021-02-28 ENCOUNTER — Encounter: Payer: Self-pay | Admitting: Cardiology

## 2021-02-28 VITALS — BP 154/110 | HR 65 | Ht 68.5 in | Wt 233.0 lb

## 2021-02-28 DIAGNOSIS — Z8249 Family history of ischemic heart disease and other diseases of the circulatory system: Secondary | ICD-10-CM

## 2021-02-28 DIAGNOSIS — I10 Essential (primary) hypertension: Secondary | ICD-10-CM

## 2021-02-28 DIAGNOSIS — R011 Cardiac murmur, unspecified: Secondary | ICD-10-CM | POA: Diagnosis not present

## 2021-02-28 DIAGNOSIS — E7800 Pure hypercholesterolemia, unspecified: Secondary | ICD-10-CM

## 2021-02-28 NOTE — Progress Notes (Signed)
Cardiology Consult  Note    Date:  02/28/2021   ID:  [name], DOB July 13, 1951, MRN [id number]  PCP:  Carlena Hurl, PA-C  Cardiologist:  Fransico Him, MD   Chief Complaint  Patient presents with  . New Patient (Initial Visit)    HTN, HLD and fm hx of CAD    History of Present Illness:  [name] is a 70 y.o. female who is being seen today for the evaluation of HTN and cardiac risk factors for CAD at the request of Carlena Hurl, PA-C.  This is a 70yo female with a hx of endometrial adenoCA, HLD and HTN.  She is referred by her PCP for evaluation due to cardiac risk factors.  SHe is here today for followup and is doing well.  SHe denies any chest pain or pressure, SOB,  PND, orthopnea, LE edema, dizziness, palpitations or syncope. She is compliant with her meds and is tolerating meds with no SE.  She has some mild DOE when she starts walking but can walk 3 miles and the more she walks the better she feels. She has a remote hx of tobacco use but quit in 05-Feb-2011.  Her father died of an MI but she says that her mother was not sure that he didn't have a cerebral aneurysm.    Past Medical History:  Diagnosis Date  . Allergy   . Arthritis   . Blindness of left eye    partial blindness  . Endometrial adenocarcinoma (HCC)    Stage IB grade 2  . Family history of malignant hyperthermia    brother  . History of brachytherapy Aug. 8, 14, 17, 29 Sept. 4   Intracavitary/Iridium 192  (3000 cGy)  . History of radiation therapy 06/20/13/17/29/13&07/17/12   HDR endometrial adenocarcinoma  . Hyperlipidemia   . Hypertension 08/2019  . Obesity   . Osteopenia   . Retinal detachment     Past Surgical History:  Procedure Laterality Date  . ABDOMINAL HYSTERECTOMY  05/09/11   Robotic TAH, BSO, and LND  . WISDOM TOOTH EXTRACTION  02/05/72    Current Medications: Current Meds  Medication Sig  . Ascorbic Acid (VITAMIN C) 1000 MG tablet   . atenolol (TENORMIN) 50 MG  tablet Take 1 tablet (50 mg total) by mouth daily.  . cholecalciferol (VITAMIN D3) 25 MCG (1000 UNIT) tablet Take 1 tablet (1,000 Units total) by mouth daily.  . Multiple Vitamins-Minerals (CENTRUM ADULTS) TABS   . rosuvastatin (CRESTOR) 10 MG tablet Take 1 tablet (10 mg total) by mouth daily.    Allergies:   Patient has no known allergies.   Social History   Socioeconomic History  . Marital status: Single    Spouse name: Not on file  . Number of children: Not on file  . Years of education: Not on file  . Highest education level: Not on file  Occupational History  . Not on file  Tobacco Use  . Smoking status: Former Smoker    Packs/day: 0.50    Years: 30.00    Pack years: 15.00    Types: Cigarettes    Quit date: 01/12/2011    Years since quitting: 10.1  . Smokeless tobacco: Never Used  Substance and Sexual Activity  . Alcohol use: Yes    Alcohol/week: 3.0 standard drinks    Types: 1 Glasses of wine, 1 Cans of beer, 1 Shots of liquor per week    Comment: rarely  . Drug use: No  .  Sexual activity: Never  Other Topics Concern  . Not on file  Social History Narrative   Lives alone.   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   01/2021   Social Determinants of Health   Financial Resource Strain: Not on file  Food Insecurity: Not on file  Transportation Needs: Not on file  Physical Activity: Not on file  Stress: Not on file  Social Connections: Not on file     Family History:  The patient's family history includes Aneurysm in her father; Cancer in her brother and mother; Colon cancer in her brother and mother; Heart disease (age of onset: 51) in her father; Kidney disease in her brother; Kidney failure in an other family member.   ROS:   Please see the history of present illness.    ROS All other systems reviewed and are negative.  No flowsheet data found.     PHYSICAL EXAM:   VS:  BP (!) 154/110   Pulse 65   Ht 5' 8.5" (1.74 m)   Wt 233 lb (105.7 kg)    BMI 34.91 kg/m    GEN: Well nourished, well developed, in no acute distress  HEENT: normal  Neck: no JVD, carotid bruits, or masses Cardiac: RRR; no murmurs, rubs, or gallops,no edema.  Intact distal pulses bilaterally.  Respiratory:  clear to auscultation bilaterally, normal work of breathing GI: soft, nontender, nondistended, + BS MS: no deformity or atrophy  Skin: warm and dry, no rash Neuro:  Alert and Oriented x 3, Strength and sensation are intact Psych: euthymic mood, full affect  Wt Readings from Last 3 Encounters:  02/28/21 233 lb (105.7 kg)  01/27/21 235 lb 6.4 oz (106.8 kg)  03/29/20 213 lb 6.4 oz (96.8 kg)      Studies/Labs Reviewed:   EKG:  EKG is ordered today.  The ekg ordered today demonstrates NSR with no ST changes  Recent Labs: 01/27/2021: ALT 16; BUN 12; Creatinine, Ser 0.99; Hemoglobin 14.5; Platelets 224; Potassium 4.3; Sodium 143   Lipid Panel    Component Value Date/Time   CHOL 125 01/27/2021 0919   TRIG 89 01/27/2021 0919   HDL 46 01/27/2021 0919   CHOLHDL 2.7 01/27/2021 0919   LDLCALC 62 01/27/2021 0919   Additional studies/ records that were reviewed today include:  none    ASSESSMENT:    1. Family history of early CAD   2. Essential hypertension, benign   3. Pure hypercholesterolemia      PLAN:  In order of problems listed above:  1.  HTN -her BP is markedly elevated today on exam -she has white coat HTN and this has been documented at her PCP office>>she took her machine to her PCP and it is accurately calibrated -her BP at home runs 98-124/55-25mmHg -continue on Tenormin50mg  daily  2.  Family hx of CAD -her dad died of an MI at 47yo -she was a former smoker and has HTN and HLD -EKG is normal and she is asymptomatic -I will get a coronary Ca score to assess future cardiac risk  3.  HLD -LDL goal < 100 -LDL was 62 and HDL was 46 -continue Crestor 10mg  daily  4.  Heart murmur -check 2D echo  Medication Adjustments/Labs  and Tests Ordered: Current medicines are reviewed at length with the patient today.  Concerns regarding medicines are outlined above.  Medication changes, Labs and Tests ordered today are listed in the Patient Instructions below.  There are no Patient Instructions on file  for this visit.   Signed, Fransico Him, MD  02/28/2021 9:18 AM    [facility] [address], [place], [place]  [address] Phone: [phone]; Fax: [phone]

## 2021-02-28 NOTE — Addendum Note (Signed)
Addended by: Antonieta Iba on: 02/28/2021 09:21 AM   Modules accepted: Orders

## 2021-02-28 NOTE — Patient Instructions (Signed)
Medication Instructions:  Your physician recommends that you continue on your current medications as directed. Please refer to the Current Medication list given to you today.  *If you need a refill on your cardiac medications before your next appointment, please call your pharmacy*   Testing/Procedures: Your physician has requested that you have an echocardiogram. Echocardiography is a painless test that uses sound waves to create images of your heart. It provides your doctor with information about the size and shape of your heart and how well your heart's chambers and valves are working. This procedure takes approximately one hour. There are no restrictions for this procedure.  Your physician has requested that you have a calcium score CT scan.    Follow-Up: At [facility], you and your health needs are our priority.  As part of our continuing mission to provide you with exceptional heart care, we have created designated Provider Care Teams.  These Care Teams include your primary Cardiologist (physician) and Advanced Practice Providers (APPs -  Physician Assistants and Nurse Practitioners) who all work together to provide you with the care you need, when you need it.  Follow up with Dr. Radford Pax as needed based on results of testing.

## 2021-04-04 ENCOUNTER — Other Ambulatory Visit: Payer: Self-pay

## 2021-04-04 ENCOUNTER — Ambulatory Visit (INDEPENDENT_AMBULATORY_CARE_PROVIDER_SITE_OTHER)
Admission: RE | Admit: 2021-04-04 | Discharge: 2021-04-04 | Disposition: A | Discharge status: Home / Self Care | Payer: Self-pay | Source: Ambulatory Visit | Attending: Cardiology | Admitting: Cardiology

## 2021-04-04 ENCOUNTER — Ambulatory Visit (HOSPITAL_COMMUNITY): Payer: Medicare PPO | Attending: Cardiovascular Disease

## 2021-04-04 DIAGNOSIS — R011 Cardiac murmur, unspecified: Secondary | ICD-10-CM | POA: Diagnosis present

## 2021-04-04 LAB — ECHOCARDIOGRAM COMPLETE
AR max vel: 2.78 cm2
AV Area VTI: 3.39 cm2
AV Area mean vel: 2.96 cm2
AV Mean grad: 5 mmHg
AV Peak grad: 12.5 mmHg
Ao pk vel: 1.77 m/s
Area-P 1/2: 3.74 cm2
S' Lateral: 3.1 cm

## 2021-09-19 ENCOUNTER — Other Ambulatory Visit: Payer: Self-pay

## 2021-09-19 ENCOUNTER — Ambulatory Visit
Admission: RE | Admit: 2021-09-19 | Discharge: 2021-09-19 | Disposition: A | Discharge status: Home / Self Care | Payer: Medicare PPO | Source: Ambulatory Visit | Attending: Medical | Admitting: Medical

## 2021-09-19 DIAGNOSIS — Z780 Asymptomatic menopausal state: Secondary | ICD-10-CM

## 2021-09-19 DIAGNOSIS — E2839 Other primary ovarian failure: Secondary | ICD-10-CM

## 2021-09-19 DIAGNOSIS — M8580 Other specified disorders of bone density and structure, unspecified site: Secondary | ICD-10-CM

## 2021-09-19 DIAGNOSIS — Z0000 Encounter for general adult medical examination without abnormal findings: Secondary | ICD-10-CM

## 2021-12-23 ENCOUNTER — Ambulatory Visit (INDEPENDENT_AMBULATORY_CARE_PROVIDER_SITE_OTHER): Payer: Medicare PPO

## 2021-12-23 VITALS — Ht 69.5 in | Wt 240.0 lb

## 2021-12-23 DIAGNOSIS — Z0000 Encounter for general adult medical examination without abnormal findings: Secondary | ICD-10-CM | POA: Diagnosis not present

## 2021-12-23 NOTE — Patient Instructions (Signed)
[name] , Thank you for taking time to come for your Medicare Wellness Visit. I appreciate your ongoing commitment to your health goals. Please review the following plan we discussed and let me know if I can assist you in the future.   Screening recommendations/referrals: Colonoscopy: completed 12/08/2019, due 12/07/2029 Mammogram: patient to schedule Bone Density: completed 09/19/2021 Recommended yearly ophthalmology/optometry visit for glaucoma screening and checkup Recommended yearly dental visit for hygiene and checkup  Vaccinations: Influenza vaccine: decline for this season Pneumococcal vaccine: completed 01/27/2021 Tdap vaccine: due now Shingles vaccine: discussed   Covid-19: 08/16/2020, 01/19/2020, 12/25/2019  Advanced directives: Advance directive discussed with you today.   Conditions/risks identified: none  Next appointment: Follow up in one year for your annual wellness visit    Preventive Care 65 Years and Older, Female Preventive care refers to lifestyle choices and visits with your health care provider that can promote health and wellness. What does preventive care include? A yearly physical exam. This is also called an annual well check. Dental exams once or twice a year. Routine eye exams. Ask your health care provider how often you should have your eyes checked. Personal lifestyle choices, including: Daily care of your teeth and gums. Regular physical activity. Eating a healthy diet. Avoiding tobacco and drug use. Limiting alcohol use. Practicing safe sex. Taking low-dose aspirin every day. Taking vitamin and mineral supplements as recommended by your health care provider. What happens during an annual well check? The services and screenings done by your health care provider during your annual well check will depend on your age, overall health, lifestyle risk factors, and family history of disease. Counseling  Your health care provider may ask you questions  about your: Alcohol use. Tobacco use. Drug use. Emotional well-being. Home and relationship well-being. Sexual activity. Eating habits. History of falls. Memory and ability to understand (cognition). Work and work Statistician. Reproductive health. Screening  You may have the following tests or measurements: Height, weight, and BMI. Blood pressure. Lipid and cholesterol levels. These may be checked every 5 years, or more frequently if you are over 26 years old. Skin check. Lung cancer screening. You may have this screening every year starting at age 21 if you have a 30-pack-year history of smoking and currently smoke or have quit within the past 15 years. Fecal occult blood test (FOBT) of the stool. You may have this test every year starting at age 28. Flexible sigmoidoscopy or colonoscopy. You may have a sigmoidoscopy every 5 years or a colonoscopy every 10 years starting at age 70. Hepatitis C blood test. Hepatitis B blood test. Sexually transmitted disease (STD) testing. Diabetes screening. This is done by checking your blood sugar (glucose) after you have not eaten for a while (fasting). You may have this done every 1-3 years. Bone density scan. This is done to screen for osteoporosis. You may have this done starting at age 14. Mammogram. This may be done every 1-2 years. Talk to your health care provider about how often you should have regular mammograms. Talk with your health care provider about your test results, treatment options, and if necessary, the need for more tests. Vaccines  Your health care provider may recommend certain vaccines, such as: Influenza vaccine. This is recommended every year. Tetanus, diphtheria, and acellular pertussis (Tdap, Td) vaccine. You may need a Td booster every 10 years. Zoster vaccine. You may need this after age 24. Pneumococcal 13-valent conjugate (PCV13) vaccine. One dose is recommended after age 60. Pneumococcal polysaccharide (PPSV23)  vaccine. One dose is recommended after age 64. Talk to your health care provider about which screenings and vaccines you need and how often you need them. This information is not intended to replace advice given to you by your health care provider. Make sure you discuss any questions you have with your health care provider. Document Released: 11/26/2015 Document Revised: 07/19/2016 Document Reviewed: 08/31/2015 Elsevier Interactive Patient Education  2017 [place] Prevention in the Home Falls can cause injuries. They can happen to people of all ages. There are many things you can do to make your home safe and to help prevent falls. What can I do on the outside of my home? Regularly fix the edges of walkways and driveways and fix any cracks. Remove anything that might make you trip as you walk through a door, such as a raised step or threshold. Trim any bushes or trees on the path to your home. Use bright outdoor lighting. Clear any walking paths of anything that might make someone trip, such as rocks or tools. Regularly check to see if handrails are loose or broken. Make sure that both sides of any steps have handrails. Any raised decks and porches should have guardrails on the edges. Have any leaves, snow, or ice cleared regularly. Use sand or salt on walking paths during winter. Clean up any spills in your garage right away. This includes oil or grease spills. What can I do in the bathroom? Use night lights. Install grab bars by the toilet and in the tub and shower. Do not use towel bars as grab bars. Use non-skid mats or decals in the tub or shower. If you need to sit down in the shower, use a plastic, non-slip stool. Keep the floor dry. Clean up any water that spills on the floor as soon as it happens. Remove soap buildup in the tub or shower regularly. Attach bath mats securely with double-sided non-slip rug tape. Do not have throw rugs and other things on the floor that  can make you trip. What can I do in the bedroom? Use night lights. Make sure that you have a light by your bed that is easy to reach. Do not use any sheets or blankets that are too big for your bed. They should not hang down onto the floor. Have a firm chair that has side arms. You can use this for support while you get dressed. Do not have throw rugs and other things on the floor that can make you trip. What can I do in the kitchen? Clean up any spills right away. Avoid walking on wet floors. Keep items that you use a lot in easy-to-reach places. If you need to reach something above you, use a strong step stool that has a grab bar. Keep electrical cords out of the way. Do not use floor polish or wax that makes floors slippery. If you must use wax, use non-skid floor wax. Do not have throw rugs and other things on the floor that can make you trip. What can I do with my stairs? Do not leave any items on the stairs. Make sure that there are handrails on both sides of the stairs and use them. Fix handrails that are broken or loose. Make sure that handrails are as long as the stairways. Check any carpeting to make sure that it is firmly attached to the stairs. Fix any carpet that is loose or worn. Avoid having throw rugs at the top or bottom of  the stairs. If you do have throw rugs, attach them to the floor with carpet tape. Make sure that you have a light switch at the top of the stairs and the bottom of the stairs. If you do not have them, ask someone to add them for you. What else can I do to help prevent falls? Wear shoes that: Do not have high heels. Have rubber bottoms. Are comfortable and fit you well. Are closed at the toe. Do not wear sandals. If you use a stepladder: Make sure that it is fully opened. Do not climb a closed stepladder. Make sure that both sides of the stepladder are locked into place. Ask someone to hold it for you, if possible. Clearly mark and make sure that you  can see: Any grab bars or handrails. First and last steps. Where the edge of each step is. Use tools that help you move around (mobility aids) if they are needed. These include: Canes. Walkers. Scooters. Crutches. Turn on the lights when you go into a dark area. Replace any light bulbs as soon as they burn out. Set up your furniture so you have a clear path. Avoid moving your furniture around. If any of your floors are uneven, fix them. If there are any pets around you, be aware of where they are. Review your medicines with your doctor. Some medicines can make you feel dizzy. This can increase your chance of falling. Ask your doctor what other things that you can do to help prevent falls. This information is not intended to replace advice given to you by your health care provider. Make sure you discuss any questions you have with your health care provider. Document Released: 08/26/2009 Document Revised: 04/06/2016 Document Reviewed: 12/04/2014 Elsevier Interactive Patient Education  2017 [employer].

## 2021-12-23 NOTE — Progress Notes (Signed)
I connected with [name] today by telephone and verified that I am speaking with the correct person using two identifiers. Location patient: home Location provider: work Persons participating in the virtual visit: Shinika Hermenia Bers LPN.   I discussed the limitations, risks, security and privacy concerns of performing an evaluation and management service by telephone and the availability of in person appointments. I also discussed with the patient that there may be a patient responsible charge related to this service. The patient expressed understanding and verbally consented to this telephonic visit.    Interactive audio and video telecommunications were attempted between this provider and patient, however failed, due to patient having technical difficulties OR patient did not have access to video capability.  We continued and completed visit with audio only.     Vital signs may be patient reported or missing.  Subjective:   [name] is a 71 y.o. female who presents for Medicare Annual (Subsequent) preventive examination.  Review of Systems     Cardiac Risk Factors include: advanced age (>28men, >57 women);dyslipidemia;hypertension;obesity (BMI >30kg/m2)     Objective:    Today's Vitals   12/23/21 0811  Weight: 240 lb (108.9 kg)  Height: 5' 9.5" (1.765 m)   Body mass index is 34.93 kg/m.  Advanced Directives 12/23/2021 10/07/2019  Does Patient Have a Medical Advance Directive? No No    Current Medications (verified) Outpatient Encounter Medications as of 12/23/2021  Medication Sig   Ascorbic Acid (VITAMIN C) 1000 MG tablet    atenolol (TENORMIN) 50 MG tablet Take 1 tablet (50 mg total) by mouth daily.   Calcium Carbonate (CALCIUM 600 PO) Take 1 tablet by mouth daily.   cholecalciferol (VITAMIN D3) 25 MCG (1000 UNIT) tablet Take 1 tablet (1,000 Units total) by mouth daily.   Multiple Vitamins-Minerals (CENTRUM ADULTS) TABS     rosuvastatin (CRESTOR) 10 MG tablet Take 1 tablet (10 mg total) by mouth daily.   No facility-administered encounter medications on file as of 12/23/2021.    Allergies (verified) Patient has no known allergies.   History: Past Medical History:  Diagnosis Date   Allergy    Arthritis    Blindness of left eye    partial blindness   Endometrial adenocarcinoma (HCC)    Stage IB grade 2   Family history of malignant hyperthermia    brother   History of brachytherapy Aug. 8, 14, 17, 29 Sept. 4   Intracavitary/Iridium 192  (3000 cGy)   History of radiation therapy 06/20/13/17/29/13&07/17/12   HDR endometrial adenocarcinoma   Hyperlipidemia    Hypertension 08/2019   Obesity    Osteopenia    Retinal detachment    Past Surgical History:  Procedure Laterality Date   ABDOMINAL HYSTERECTOMY  05/09/11   Robotic TAH, BSO, and LND   WISDOM TOOTH EXTRACTION  1973   Family History  Problem Relation Age of Onset   Colon cancer Mother    Cancer Mother        colon   Aneurysm Father    Heart disease Father 31       MI vs aneurysm   Colon cancer Brother    Kidney disease Brother    Kidney failure Other    Cancer Brother        colon   Social History   Socioeconomic History   Marital status: Single    Spouse name: Not on file   Number of children: Not on file   Years of education: Not on  file   Highest education level: Not on file  Occupational History   Not on file  Tobacco Use   Smoking status: Former    Packs/day: 0.50    Years: 30.00    Pack years: 15.00    Types: Cigarettes    Quit date: 01/12/2011    Years since quitting: 10.9   Smokeless tobacco: Never  Vaping Use   Vaping Use: Never used  Substance and Sexual Activity   Alcohol use: Not Currently    Comment: rarely   Drug use: No   Sexual activity: Never  Other Topics Concern   Not on file  Social History Narrative   Lives alone.   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   01/2021   Social Determinants of  Health   Financial Resource Strain: Low Risk    Difficulty of Paying Living Expenses: Not hard at all  Food Insecurity: No Food Insecurity   Worried About Charity fundraiser in the Last Year: Never true   Ran Out of Food in the Last Year: Never true  Transportation Needs: No Transportation Needs   Lack of Transportation (Medical): No   Lack of Transportation (Non-Medical): No  Physical Activity: Sufficiently Active   Days of Exercise per Week: 7 days   Minutes of Exercise per Session: 30 min  Stress: No Stress Concern Present   Feeling of Stress : Not at all  Social Connections: Not on file    Tobacco Counseling Counseling given: Not Answered   Clinical Intake:  Pre-visit preparation completed: Yes  Pain : No/denies pain     Nutritional Status: BMI > 30  Obese Nutritional Risks: None Diabetes: No  How often do you need to have someone help you when you read instructions, pamphlets, or other written materials from your doctor or pharmacy?: 1 - Never What is the last grade level you completed in school?: 12th grade  Diabetic? no  Interpreter Needed?: No  Information entered by :: NAllen LPN   Activities of Daily Living In your present state of health, do you have any difficulty performing the following activities: 12/23/2021 01/27/2021  Hearing? N N  Vision? N N  Difficulty concentrating or making decisions? N N  Walking or climbing stairs? N N  Dressing or bathing? N N  Doing errands, shopping? N N  Preparing Food and eating ? N N  Using the Toilet? N N  In the past six months, have you accidently leaked urine? N N  Do you have problems with loss of bowel control? N N  Managing your Medications? N N  Managing your Finances? N N  Housekeeping or managing your Housekeeping? N N  Some recent data might be hidden    Patient Care Team: Tysinger, Camelia Eng, PA-C as PCP - General (Family Medicine) Sueanne Margarita, MD as PCP - Cardiology (Cardiology)  Indicate  any recent Medical Services you may have received from other than [facility] providers in the past year (date may be approximate).     Assessment:   This is a routine wellness examination for [name].  Hearing/Vision screen Vision Screening - Comments:: No regular eye exams,  Dietary issues and exercise activities discussed: Current Exercise Habits: Home exercise routine, Type of exercise: Other - see comments (spin cycle), Time (Minutes): 30, Frequency (Times/Week): 7, Weekly Exercise (Minutes/Week): 210   Goals Addressed             This Visit's Progress    Patient Stated  12/23/2021, wants to get back to walking and lose weight       Depression Screen PHQ 2/9 Scores 12/23/2021 01/27/2021 10/07/2019 09/04/2019  PHQ - 2 Score 0 0 0 0    Fall Risk Fall Risk  12/23/2021 01/27/2021 10/07/2019 09/04/2019  Falls in the past year? 0 0 0 0  Risk for fall due to : Medication side effect No Fall Risks - -  Follow up Falls evaluation completed;Education provided;Falls prevention discussed Falls evaluation completed - -    FALL RISK PREVENTION PERTAINING TO THE HOME:  Any stairs in or around the home? Yes  If so, are there any without handrails? No  Home free of loose throw rugs in walkways, pet beds, electrical cords, etc? Yes  Adequate lighting in your home to reduce risk of falls? Yes   ASSISTIVE DEVICES UTILIZED TO PREVENT FALLS:  Life alert? No  Use of a cane, walker or w/c? No  Grab bars in the bathroom? No  Shower chair or bench in shower? No  Elevated toilet seat or a handicapped toilet? No   TIMED UP AND GO:  Was the test performed? No .      Cognitive Function:     6CIT Screen 12/23/2021  What Year? 0 points  What month? 0 points  What time? 0 points  Count back from 20 0 points  Months in reverse 0 points  Repeat phrase 0 points  Total Score 0    Immunizations Immunization History  Administered Date(s) Administered   Influenza, High Dose Seasonal  PF 08/22/2019   PFIZER(Purple Top)SARS-COV-2 Vaccination 12/25/2019, 01/19/2020, 08/16/2020   Pneumococcal Conjugate-13 10/07/2019   Pneumococcal Polysaccharide-23 01/27/2021    TDAP status: Due, Education has been provided regarding the importance of this vaccine. Advised may receive this vaccine at local pharmacy or Health Dept. Aware to provide a copy of the vaccination record if obtained from local pharmacy or Health Dept. Verbalized acceptance and understanding.  Flu Vaccine status: Due, Education has been provided regarding the importance of this vaccine. Advised may receive this vaccine at local pharmacy or Health Dept. Aware to provide a copy of the vaccination record if obtained from local pharmacy or Health Dept. Verbalized acceptance and understanding.  Pneumococcal vaccine status: Up to date  Covid-19 vaccine status: Completed vaccines  Qualifies for Shingles Vaccine? Yes   Zostavax completed No   Shingrix Completed?: No.    Education has been provided regarding the importance of this vaccine. Patient has been advised to call insurance company to determine out of pocket expense if they have not yet received this vaccine. Advised may also receive vaccine at local pharmacy or Health Dept. Verbalized acceptance and understanding.  Screening Tests Health Maintenance  Topic Date Due   TETANUS/TDAP  Never done   Zoster Vaccines- Shingrix (1 of 2) Never done   MAMMOGRAM  09/15/2021   COVID-19 Vaccine (4 - Booster for Pfizer series) 01/08/2022 (Originally 10/11/2020)   INFLUENZA VACCINE  02/10/2022 (Originally 06/13/2021)   COLONOSCOPY (Pts 45-39yrs Insurance coverage will need to be confirmed)  12/07/2029   Pneumonia Vaccine 58+ Years old  Completed   DEXA SCAN  Completed   Hepatitis C Screening  Completed   HPV VACCINES  Aged Out    Health Maintenance  Health Maintenance Due  Topic Date Due   TETANUS/TDAP  Never done   Zoster Vaccines- Shingrix (1 of 2) Never done    MAMMOGRAM  09/15/2021    Colorectal cancer screening: Type of screening: Colonoscopy. Completed  12/08/2019. Repeat every 10 years  Mammogram status: patient to schedule  Bone Density status: Completed 09/19/2021.   Lung Cancer Screening: (Low Dose CT Chest recommended if Age 80-80 years, 30 pack-year currently smoking OR have quit w/in 15years.) does not qualify.   Lung Cancer Screening Referral: no  Additional Screening:  Hepatitis C Screening: does qualify; Completed 10/07/2019  Vision Screening: Recommended annual ophthalmology exams for early detection of glaucoma and other disorders of the eye. Is the patient up to date with their annual eye exam?  No  Who is the provider or what is the name of the office in which the patient attends annual eye exams? none If pt is not established with a provider, would they like to be referred to a provider to establish care? No .   Dental Screening: Recommended annual dental exams for proper oral hygiene  Community Resource Referral / Chronic Care Management: CRR required this visit?  No   CCM required this visit?  No      Plan:     I have personally reviewed and noted the following in the patients chart:   Medical and social history Use of alcohol, tobacco or illicit drugs  Current medications and supplements including opioid prescriptions.  Functional ability and status Nutritional status Physical activity Advanced directives List of other physicians Hospitalizations, surgeries, and ER visits in previous 12 months Vitals Screenings to include cognitive, depression, and falls Referrals and appointments  In addition, I have reviewed and discussed with patient certain preventive protocols, quality metrics, and best practice recommendations. A written personalized care plan for preventive services as well as general preventive health recommendations were provided to patient.     Kellie Simmering, LPN   [id number]   Nurse  Notes: none  Due to this being a virtual visit, the after visit summary with patients personalized plan was offered to patient via mail or my-chart. Patient would like to access on my-chart

## 2021-12-28 ENCOUNTER — Encounter: Payer: Self-pay | Admitting: Internal Medicine

## 2022-01-23 ENCOUNTER — Other Ambulatory Visit: Payer: Self-pay | Admitting: Medical

## 2022-01-23 DIAGNOSIS — Z1231 Encounter for screening mammogram for malignant neoplasm of breast: Secondary | ICD-10-CM

## 2022-01-30 ENCOUNTER — Ambulatory Visit
Admission: RE | Admit: 2022-01-30 | Discharge: 2022-01-30 | Disposition: A | Discharge status: Home / Self Care | Payer: Medicare PPO | Source: Ambulatory Visit | Attending: Medical | Admitting: Medical

## 2022-01-30 DIAGNOSIS — Z1231 Encounter for screening mammogram for malignant neoplasm of breast: Secondary | ICD-10-CM

## 2022-02-18 ENCOUNTER — Other Ambulatory Visit: Payer: Self-pay | Admitting: Medical

## 2022-03-06 ENCOUNTER — Ambulatory Visit: Payer: Medicare PPO | Admitting: Medical

## 2022-03-06 VITALS — BP 130/70 | HR 73 | Wt 247.0 lb

## 2022-03-06 DIAGNOSIS — Z8542 Personal history of malignant neoplasm of other parts of uterus: Secondary | ICD-10-CM | POA: Diagnosis not present

## 2022-03-06 DIAGNOSIS — I10 Essential (primary) hypertension: Secondary | ICD-10-CM

## 2022-03-06 DIAGNOSIS — Z90710 Acquired absence of both cervix and uterus: Secondary | ICD-10-CM | POA: Diagnosis not present

## 2022-03-06 DIAGNOSIS — E7800 Pure hypercholesterolemia, unspecified: Secondary | ICD-10-CM | POA: Diagnosis not present

## 2022-03-06 DIAGNOSIS — E2839 Other primary ovarian failure: Secondary | ICD-10-CM

## 2022-03-06 DIAGNOSIS — Z0000 Encounter for general adult medical examination without abnormal findings: Secondary | ICD-10-CM

## 2022-03-06 DIAGNOSIS — Z87891 Personal history of nicotine dependence: Secondary | ICD-10-CM

## 2022-03-06 DIAGNOSIS — Z923 Personal history of irradiation: Secondary | ICD-10-CM | POA: Diagnosis not present

## 2022-03-06 DIAGNOSIS — L989 Disorder of the skin and subcutaneous tissue, unspecified: Secondary | ICD-10-CM

## 2022-03-06 DIAGNOSIS — Z7185 Encounter for immunization safety counseling: Secondary | ICD-10-CM

## 2022-03-06 DIAGNOSIS — R7301 Impaired fasting glucose: Secondary | ICD-10-CM | POA: Diagnosis not present

## 2022-03-06 DIAGNOSIS — Z800 Family history of malignant neoplasm of digestive organs: Secondary | ICD-10-CM

## 2022-03-06 NOTE — Progress Notes (Signed)
Subjective:  ? ?HPI ? [name] is a 71 y.o. female who presents for ?Chief Complaint  ?Patient presents with  ? med check  ?  Med check, checks bp at home some cause gets nerves coming to doctor and goes up. Bp at home 120-125/ 54-74  ? ? ?Patient Care Team: ?Fayetta Sorenson, Leward Quan as PCP - General (Family Medicine) ?Sueanne Margarita, MD as PCP - Cardiology (Cardiology) ? ?Concerns: ?None, doing fine.   ? ?Exercises with spin cycle except in winter.   ? ?Hx/o total hysterectomy, 2012 ? ?Reviewed their medical, surgical, family, social, medication, and allergy history and updated chart as appropriate. ? ?Past Medical History:  ?Diagnosis Date  ? Allergy   ? Arthritis   ? Blindness of left eye   ? partial blindness  ? Endometrial adenocarcinoma ([place])   ? Stage IB grade 2  ? Family history of malignant hyperthermia   ? brother  ? History of brachytherapy Aug. 8, 14, 17, 29 Sept. 4  ? Intracavitary/Iridium 192  (3000 cGy)  ? History of radiation therapy 06/20/13/17/29/13&07/17/12  ? HDR endometrial adenocarcinoma  ? Hyperlipidemia   ? Hypertension 08/2019  ? Obesity   ? Osteopenia   ? Retinal detachment   ? ? ?Family History  ?Problem Relation Age of Onset  ? Colon cancer Mother   ? Cancer Mother   ?     colon  ? Aneurysm Father   ? Heart disease Father 60  ?     MI vs aneurysm  ? Colon cancer Brother   ? Kidney disease Brother   ? Kidney failure Other   ? Cancer Brother   ?     colon  ? ? ? ?Current Outpatient Medications:  ?  Ascorbic Acid (VITAMIN C) 1000 MG tablet, , Disp: , Rfl:  ?  atenolol (TENORMIN) 50 MG tablet, TAKE 1 TABLET BY MOUTH EVERY DAY, Disp: 90 tablet, Rfl: 0 ?  Calcium Carbonate (CALCIUM 600 PO), Take 1 tablet by mouth daily., Disp: , Rfl:  ?  cholecalciferol (VITAMIN D3) 25 MCG (1000 UNIT) tablet, Take 1 tablet (1,000 Units total) by mouth daily., Disp: 90 tablet, Rfl: 3 ?  Multiple Vitamins-Minerals (CENTRUM ADULTS) TABS, , Disp: , Rfl:  ?  rosuvastatin (CRESTOR) 10 MG tablet, TAKE 1  TABLET BY MOUTH EVERY DAY, Disp: 90 tablet, Rfl: 0 ? ?No Known Allergies ? ? ? ?Review of Systems ?Constitutional: -fever, -chills, -sweats, -unexpected weight change, -decreased appetite, -fatigue ?Allergy: -sneezing, -itching, -congestion ?Dermatology: -changing moles, --rash, -lumps ?ENT: -runny nose, -ear pain, -sore throat, -hoarseness, -sinus pain, -teeth pain, - ringing in ears, -hearing loss, -nosebleeds ?Cardiology: -chest pain, -palpitations, -swelling, -difficulty breathing when lying flat, -waking up short of breath ?Respiratory: -cough, -shortness of breath, -difficulty breathing with exercise or exertion, -wheezing, -coughing up blood ?Gastroenterology: -abdominal pain, -nausea, -vomiting, -diarrhea, -constipation, -blood in stool, -changes in bowel movement, -difficulty swallowing or eating ?Hematology: -bleeding, -bruising  ?Musculoskeletal: -joint aches, -muscle aches, -joint swelling, -back pain, -neck pain, -cramping, -changes in gait ?Ophthalmology: denies vision changes, eye redness, itching, discharge ?Urology: -burning with urination, -difficulty urinating, -blood in urine, -urinary frequency, -urgency, -incontinence ?Neurology: -headache, -weakness, -tingling, -numbness, -memory loss, -falls, -dizziness ?Psychology: -depressed mood, -agitation, -sleep problems ?Breast/gyn: -breast tendnerss, -discharge, -lumps, -vaginal discharge,- irregular periods, -heavy periods ? ? ? ?  03/06/2022  ? 11:36 AM 12/23/2021  ?  8:17 AM 01/27/2021  ?  8:36 AM 10/07/2019  ?  9:05 AM 09/04/2019  ?  11:07 AM  ?Depression screen PHQ 2/9  ?Decreased Interest 0 0 0 0 0  ?Down, Depressed, Hopeless 0 0 0 0 0  ?PHQ - 2 Score 0 0 0 0 0  ? ? ?   ?Objective:  ?BP 130/70   Pulse 73   Wt 247 lb (112 kg)   BMI 35.95 kg/m?  ? ?General appearance: alert, no distress, WD/WN, Caucasian female ?Skin: scattered macules, no worrisome lesions ?HEENT: normocephalic, conjunctiva/corneas normal, sclerae anicteric, PERRLA, EOMi ?Neck:  supple, no lymphadenopathy, no thyromegaly, no masses, normal ROM, no bruits ?Chest: non tender, normal shape and expansion ?Heart: RRR, normal S1, S2, no murmurs ?Lungs: CTA bilaterally, no wheezes, rhonchi, or rales ?Abdomen: +bs, soft, non tender, non distended, no masses, no hepatomegaly, no splenomegaly, no bruits ?Back: non tender, normal ROM, no scoliosis ?Musculoskeletal: upper extremities non tender, no obvious deformity, normal ROM throughout, lower extremities non tender, no obvious deformity, normal ROM throughout ?Extremities: no edema, no cyanosis, no clubbing ?Pulses: 2+ symmetric, upper and lower extremities, normal cap refill ?Neurological: alert, oriented x 3, CN2-12 intact, strength normal upper extremities and lower extremities, sensation normal throughout, DTRs 2+ throughout, no cerebellar signs, gait normal ?Psychiatric: normal affect, behavior normal, pleasant  ?Breast/gyn/rectal - deferred /declined ? ? ? ?Assessment and Plan :  ? ?Encounter Diagnoses  ?Name Primary?  ? Encounter for health maintenance examination in adult Yes  ? Essential hypertension, benign   ? Pure hypercholesterolemia   ? Vaccine counseling   ? Impaired fasting blood sugar   ? S/P hysterectomy   ? History of endometrial cancer   ? History of brachytherapy   ? History of radiation therapy   ? Former smoker   ? Family history of colon cancer   ? Estrogen deficiency   ? Skin lesion   ? ? ? ?This visit was a preventative care visit, also known as wellness visit or routine physical.   Topics typically include healthy lifestyle, diet, exercise, preventative care, vaccinations, sick and well care, proper use of emergency dept and after hours care, as well as other concerns.   ? ? ?Recommendations: ?Continue to return yearly for your annual wellness and preventative care visits.  This gives [place] a chance to discuss healthy lifestyle, exercise, vaccinations, review your chart record, and perform screenings where appropriate. ? ?I  recommend you see your eye doctor yearly for routine vision care. ? ?I recommend you see your dentist yearly for routine dental care including hygiene visits twice yearly. ? ? ?Vaccination recommendations were reviewed ?Immunization History  ?Administered Date(s) Administered  ? Influenza, High Dose Seasonal PF 08/22/2019  ? PFIZER(Purple Top)SARS-COV-2 Vaccination 12/25/2019, 01/19/2020, 08/16/2020  ? Pneumococcal Conjugate-13 10/07/2019  ? Pneumococcal Polysaccharide-23 01/27/2021  ? ? ?I recommend you get tetanus and shingles vaccines at the pharmacy. ? ? ?Screening for cancer: ?Colon cancer screening: ?I reviewed your colonoscopy on file that is up to date  ? ?Breast cancer screening: ?You should perform a self breast exam monthly.   ?We reviewed recommendations for regular mammograms and breast cancer screening. ? ?Cervical cancer screening: ?Status post total hysterectomy ?  ?Skin cancer screening: ?Check your skin regularly for new changes, growing lesions, or other lesions of concern ?Come in for evaluation if you have skin lesions of concern. ? ?Lung cancer screening: ?If you have a greater than 20 pack year history of tobacco use, then you may qualify for lung cancer screening with a chest CT scan.   Please call your insurance company to  inquire about coverage for this test. ? ?We currently don't have screenings for other cancers besides breast, cervical, colon, and lung cancers.  If you have a strong family history of cancer or have other cancer screening concerns, please let me know.  ? ? ?Bone health: ?Get at least 150 minutes of aerobic exercise weekly ?Get weight bearing exercise at least once weekly ?Bone density test:  ?A bone density test is an imaging test that uses a type of X-ray to measure the amount of calcium and other minerals in your bones. ?The test may be used to diagnose or screen you for a condition that causes weak or thin bones (osteoporosis), predict your risk for a broken bone  (fracture), or determine how well your osteoporosis treatment is working. ?The bone density test is recommended for females 40 and older, or females or males <39 if certain risk factors such as thyroid disease, long

## 2022-03-06 NOTE — Assessment & Plan Note (Signed)
Continue statin rosuvastatin 10 mg daily.  Updated labs today. ?

## 2022-03-06 NOTE — Assessment & Plan Note (Signed)
Controlled on atenolol 50 mg daily ?

## 2022-03-07 ENCOUNTER — Other Ambulatory Visit: Payer: Self-pay | Admitting: Medical

## 2022-03-07 LAB — CBC
Hematocrit: 43.8 % (ref 34.0–46.6)
Hemoglobin: 15.2 g/dL (ref 11.1–15.9)
MCH: 31.1 pg (ref 26.6–33.0)
MCHC: 34.7 g/dL (ref 31.5–35.7)
MCV: 90 fL (ref 79–97)
Platelets: 219 10*3/uL (ref 150–450)
RBC: 4.88 x10E6/uL (ref 3.77–5.28)
RDW: 12.7 % (ref 11.7–15.4)
WBC: 6.9 10*3/uL (ref 3.4–10.8)

## 2022-03-07 LAB — HEMOGLOBIN A1C
Est. average glucose Bld gHb Est-mCnc: 108 mg/dL
Hgb A1c MFr Bld: 5.4 % (ref 4.8–5.6)

## 2022-03-07 LAB — COMPREHENSIVE METABOLIC PANEL
ALT: 16 IU/L (ref 0–32)
AST: 19 IU/L (ref 0–40)
Albumin/Globulin Ratio: 2.2 (ref 1.2–2.2)
Albumin: 4.6 g/dL (ref 3.8–4.8)
Alkaline Phosphatase: 76 IU/L (ref 44–121)
BUN/Creatinine Ratio: 14 (ref 12–28)
BUN: 13 mg/dL (ref 8–27)
Bilirubin Total: 0.6 mg/dL (ref 0.0–1.2)
CO2: 23 mmol/L (ref 20–29)
Calcium: 9.2 mg/dL (ref 8.7–10.3)
Chloride: 105 mmol/L (ref 96–106)
Creatinine, Ser: 0.92 mg/dL (ref 0.57–1.00)
Globulin, Total: 2.1 g/dL (ref 1.5–4.5)
Glucose: 95 mg/dL (ref 70–99)
Potassium: 4.8 mmol/L (ref 3.5–5.2)
Sodium: 145 mmol/L — ABNORMAL HIGH (ref 134–144)
Total Protein: 6.7 g/dL (ref 6.0–8.5)
eGFR: 68 mL/min/{1.73_m2} (ref >59)

## 2022-03-07 LAB — LIPID PANEL
Chol/HDL Ratio: 3 ratio (ref 0.0–4.4)
Cholesterol, Total: 121 mg/dL (ref 100–199)
HDL: 41 mg/dL (ref >39)
LDL Chol Calc (NIH): 60 mg/dL (ref 0–99)
Triglycerides: 109 mg/dL (ref 0–149)
VLDL Cholesterol Cal: 20 mg/dL (ref 5–40)

## 2022-03-07 MED ORDER — VITAMIN D 25 MCG (1000 UNIT) PO TABS: 1000.0000 [IU] | ORAL_TABLET | Freq: Every day | ORAL | 3 refills | Status: DC

## 2022-05-19 ENCOUNTER — Other Ambulatory Visit: Payer: Self-pay | Admitting: Medical

## 2022-05-27 ENCOUNTER — Other Ambulatory Visit: Payer: Self-pay | Admitting: Medical

## 2022-07-05 DIAGNOSIS — L814 Other melanin hyperpigmentation: Secondary | ICD-10-CM | POA: Diagnosis not present

## 2022-07-05 DIAGNOSIS — D225 Melanocytic nevi of trunk: Secondary | ICD-10-CM | POA: Diagnosis not present

## 2022-07-05 DIAGNOSIS — L821 Other seborrheic keratosis: Secondary | ICD-10-CM | POA: Diagnosis not present

## 2022-07-05 DIAGNOSIS — L570 Actinic keratosis: Secondary | ICD-10-CM | POA: Diagnosis not present

## 2022-07-19 ENCOUNTER — Encounter: Payer: Self-pay | Admitting: Internal Medicine

## 2022-08-11 ENCOUNTER — Other Ambulatory Visit: Payer: Self-pay | Admitting: Medical

## 2022-08-22 ENCOUNTER — Encounter: Payer: Self-pay | Admitting: Internal Medicine

## 2022-08-24 ENCOUNTER — Other Ambulatory Visit: Payer: Self-pay | Admitting: Medical

## 2022-09-12 DIAGNOSIS — L570 Actinic keratosis: Secondary | ICD-10-CM | POA: Diagnosis not present

## 2022-09-12 DIAGNOSIS — L578 Other skin changes due to chronic exposure to nonionizing radiation: Secondary | ICD-10-CM | POA: Diagnosis not present

## 2022-11-04 ENCOUNTER — Other Ambulatory Visit: Payer: Self-pay | Admitting: Medical

## 2022-11-30 DIAGNOSIS — L570 Actinic keratosis: Secondary | ICD-10-CM | POA: Diagnosis not present

## 2022-12-26 ENCOUNTER — Ambulatory Visit (INDEPENDENT_AMBULATORY_CARE_PROVIDER_SITE_OTHER): Payer: Medicare PPO

## 2022-12-26 VITALS — Ht 69.0 in | Wt 244.0 lb

## 2022-12-26 DIAGNOSIS — Z0000 Encounter for general adult medical examination without abnormal findings: Secondary | ICD-10-CM | POA: Diagnosis not present

## 2022-12-26 NOTE — Patient Instructions (Signed)
Ms. [name] , Thank you for taking time to come for your Medicare Wellness Visit. I appreciate your ongoing commitment to your health goals. Please review the following plan we discussed and let me know if I can assist you in the future.   These are the goals we discussed:  Goals      Patient Stated     12/23/2021, wants to get back to walking and lose weight     Patient Stated     12/26/2022, wants to exercise more, start walking and eat a better diet        This is a list of the screening recommended for you and due dates:  Health Maintenance  Topic Date Due   DTaP/Tdap/Td vaccine (1 - Tdap) Never done   Flu Shot  06/13/2022   COVID-19 Vaccine (4 - 2023-24 season) 07/14/2022   Medicare Annual Wellness Visit  12/27/2023   Mammogram  01/31/2024   Colon Cancer Screening  12/07/2029   Pneumonia Vaccine  Completed   DEXA scan (bone density measurement)  Completed   Hepatitis C Screening: USPSTF Recommendation to screen - Ages 61-79 yo.  Completed   Zoster (Shingles) Vaccine  Completed   HPV Vaccine  Aged Out    Advanced directives: Advance directive discussed with you today.   Conditions/risks identified: none  Next appointment: Follow up in one year for your annual wellness visit    Preventive Care 65 Years and Older, Female Preventive care refers to lifestyle choices and visits with your health care provider that can promote health and wellness. What does preventive care include? A yearly physical exam. This is also called an annual well check. Dental exams once or twice a year. Routine eye exams. Ask your health care provider how often you should have your eyes checked. Personal lifestyle choices, including: Daily care of your teeth and gums. Regular physical activity. Eating a healthy diet. Avoiding tobacco and drug use. Limiting alcohol use. Practicing safe sex. Taking low-dose aspirin every day. Taking vitamin and mineral supplements as recommended by your health  care provider. What happens during an annual well check? The services and screenings done by your health care provider during your annual well check will depend on your age, overall health, lifestyle risk factors, and family history of disease. Counseling  Your health care provider may ask you questions about your: Alcohol use. Tobacco use. Drug use. Emotional well-being. Home and relationship well-being. Sexual activity. Eating habits. History of falls. Memory and ability to understand (cognition). Work and work Statistician. Reproductive health. Screening  You may have the following tests or measurements: Height, weight, and BMI. Blood pressure. Lipid and cholesterol levels. These may be checked every 5 years, or more frequently if you are over 80 years old. Skin check. Lung cancer screening. You may have this screening every year starting at age 52 if you have a 30-pack-year history of smoking and currently smoke or have quit within the past 15 years. Fecal occult blood test (FOBT) of the stool. You may have this test every year starting at age 63. Flexible sigmoidoscopy or colonoscopy. You may have a sigmoidoscopy every 5 years or a colonoscopy every 10 years starting at age 45. Hepatitis C blood test. Hepatitis B blood test. Sexually transmitted disease (STD) testing. Diabetes screening. This is done by checking your blood sugar (glucose) after you have not eaten for a while (fasting). You may have this done every 1-3 years. Bone density scan. This is done to screen for osteoporosis.  You may have this done starting at age 5. Mammogram. This may be done every 1-2 years. Talk to your health care provider about how often you should have regular mammograms. Talk with your health care provider about your test results, treatment options, and if necessary, the need for more tests. Vaccines  Your health care provider may recommend certain vaccines, such as: Influenza vaccine. This is  recommended every year. Tetanus, diphtheria, and acellular pertussis (Tdap, Td) vaccine. You may need a Td booster every 10 years. Zoster vaccine. You may need this after age 59. Pneumococcal 13-valent conjugate (PCV13) vaccine. One dose is recommended after age 58. Pneumococcal polysaccharide (PPSV23) vaccine. One dose is recommended after age 71. Talk to your health care provider about which screenings and vaccines you need and how often you need them. This information is not intended to replace advice given to you by your health care provider. Make sure you discuss any questions you have with your health care provider. Document Released: 11/26/2015 Document Revised: 07/19/2016 Document Reviewed: 08/31/2015 Elsevier Interactive Patient Education  2017 [place] Prevention in the Home Falls can cause injuries. They can happen to people of all ages. There are many things you can do to make your home safe and to help prevent falls. What can I do on the outside of my home? Regularly fix the edges of walkways and driveways and fix any cracks. Remove anything that might make you trip as you walk through a door, such as a raised step or threshold. Trim any bushes or trees on the path to your home. Use bright outdoor lighting. Clear any walking paths of anything that might make someone trip, such as rocks or tools. Regularly check to see if handrails are loose or broken. Make sure that both sides of any steps have handrails. Any raised decks and porches should have guardrails on the edges. Have any leaves, snow, or ice cleared regularly. Use sand or salt on walking paths during winter. Clean up any spills in your garage right away. This includes oil or grease spills. What can I do in the bathroom? Use night lights. Install grab bars by the toilet and in the tub and shower. Do not use towel bars as grab bars. Use non-skid mats or decals in the tub or shower. If you need to sit down in  the shower, use a plastic, non-slip stool. Keep the floor dry. Clean up any water that spills on the floor as soon as it happens. Remove soap buildup in the tub or shower regularly. Attach bath mats securely with double-sided non-slip rug tape. Do not have throw rugs and other things on the floor that can make you trip. What can I do in the bedroom? Use night lights. Make sure that you have a light by your bed that is easy to reach. Do not use any sheets or blankets that are too big for your bed. They should not hang down onto the floor. Have a firm chair that has side arms. You can use this for support while you get dressed. Do not have throw rugs and other things on the floor that can make you trip. What can I do in the kitchen? Clean up any spills right away. Avoid walking on wet floors. Keep items that you use a lot in easy-to-reach places. If you need to reach something above you, use a strong step stool that has a grab bar. Keep electrical cords out of the way. Do not use  floor polish or wax that makes floors slippery. If you must use wax, use non-skid floor wax. Do not have throw rugs and other things on the floor that can make you trip. What can I do with my stairs? Do not leave any items on the stairs. Make sure that there are handrails on both sides of the stairs and use them. Fix handrails that are broken or loose. Make sure that handrails are as long as the stairways. Check any carpeting to make sure that it is firmly attached to the stairs. Fix any carpet that is loose or worn. Avoid having throw rugs at the top or bottom of the stairs. If you do have throw rugs, attach them to the floor with carpet tape. Make sure that you have a light switch at the top of the stairs and the bottom of the stairs. If you do not have them, ask someone to add them for you. What else can I do to help prevent falls? Wear shoes that: Do not have high heels. Have rubber bottoms. Are comfortable  and fit you well. Are closed at the toe. Do not wear sandals. If you use a stepladder: Make sure that it is fully opened. Do not climb a closed stepladder. Make sure that both sides of the stepladder are locked into place. Ask someone to hold it for you, if possible. Clearly mark and make sure that you can see: Any grab bars or handrails. First and last steps. Where the edge of each step is. Use tools that help you move around (mobility aids) if they are needed. These include: Canes. Walkers. Scooters. Crutches. Turn on the lights when you go into a dark area. Replace any light bulbs as soon as they burn out. Set up your furniture so you have a clear path. Avoid moving your furniture around. If any of your floors are uneven, fix them. If there are any pets around you, be aware of where they are. Review your medicines with your doctor. Some medicines can make you feel dizzy. This can increase your chance of falling. Ask your doctor what other things that you can do to help prevent falls. This information is not intended to replace advice given to you by your health care provider. Make sure you discuss any questions you have with your health care provider. Document Released: 08/26/2009 Document Revised: 04/06/2016 Document Reviewed: 12/04/2014 Elsevier Interactive Patient Education  2017 [employer].

## 2022-12-26 NOTE — Progress Notes (Signed)
I connected with  [name] on 12/26/22 by a audio enabled telemedicine application and verified that I am speaking with the correct person using two identifiers.  Patient Location: Home  Provider Location: Office/Clinic  I discussed the limitations of evaluation and management by telemedicine. The patient expressed understanding and agreed to proceed.  Subjective:   [name] is a 72 y.o. female who presents for Medicare Annual (Subsequent) preventive examination.  Review of Systems     Cardiac Risk Factors include: advanced age (>105mn, >>38women);dyslipidemia;hypertension;obesity (BMI >30kg/m2)     Objective:    Today's Vitals   12/26/22 0811  Weight: 244 lb (110.7 kg)  Height: 5' 9"$  (1.753 m)   Body mass index is 36.03 kg/m.     12/26/2022    8:14 AM 12/23/2021    8:16 AM 10/07/2019    9:11 AM  Advanced Directives  Does Patient Have a Medical Advance Directive? No No No    Current Medications (verified) Outpatient Encounter Medications as of 12/26/2022  Medication Sig   Ascorbic Acid (VITAMIN C) 1000 MG tablet    atenolol (TENORMIN) 50 MG tablet TAKE 1 TABLET BY MOUTH EVERY DAY   Calcium Carbonate (CALCIUM 600 PO) Take 1 tablet by mouth daily.   cholecalciferol (VITAMIN D3) 25 MCG (1000 UNIT) tablet Take 1 tablet (1,000 Units total) by mouth daily.   Multiple Vitamins-Minerals (CENTRUM ADULTS) TABS    rosuvastatin (CRESTOR) 10 MG tablet TAKE 1 TABLET BY MOUTH EVERY DAY   No facility-administered encounter medications on file as of 12/26/2022.    Allergies (verified) Patient has no known allergies.   History: Past Medical History:  Diagnosis Date   Allergy    Arthritis    Blindness of left eye    partial blindness   Endometrial adenocarcinoma (HCC)    Stage IB grade 2   Family history of malignant hyperthermia    brother   History of brachytherapy Aug. 8, 14, 17, 29 Sept. 4   Intracavitary/Iridium 192  (3000 cGy)   History of  radiation therapy 06/20/13/17/29/13&07/17/12   HDR endometrial adenocarcinoma   Hyperlipidemia    Hypertension 08/2019   Obesity    Osteopenia    Retinal detachment    Past Surgical History:  Procedure Laterality Date   ABDOMINAL HYSTERECTOMY  05/09/11   Robotic TAH, BSO, and LND   WISDOM TOOTH EXTRACTION  1973   Family History  Problem Relation Age of Onset   Colon cancer Mother    Cancer Mother        colon   Aneurysm Father    Heart disease Father 669      MI vs aneurysm   Colon cancer Brother    Kidney disease Brother    Kidney failure Other    Cancer Brother        colon   Social History   Socioeconomic History   Marital status: Single    Spouse name: Not on file   Number of children: Not on file   Years of education: Not on file   Highest education level: Not on file  Occupational History   Not on file  Tobacco Use   Smoking status: Former    Packs/day: 0.50    Years: 30.00    Total pack years: 15.00    Types: Cigarettes    Quit date: 01/12/2011    Years since quitting: 11.9   Smokeless tobacco: Never  Vaping Use   Vaping Use: Never used  Substance and Sexual Activity   Alcohol use: Not Currently    Comment: rarely   Drug use: No   Sexual activity: Never  Other Topics Concern   Not on file  Social History Narrative   Lives alone.   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   01/2021   Social Determinants of Health   Financial Resource Strain: Low Risk  (12/26/2022)   Overall Financial Resource Strain (CARDIA)    Difficulty of Paying Living Expenses: Not hard at all  Food Insecurity: No Food Insecurity (12/26/2022)   Hunger Vital Sign    Worried About Running Out of Food in the Last Year: Never true    Ran Out of Food in the Last Year: Never true  Transportation Needs: No Transportation Needs (12/26/2022)   PRAPARE - Hydrologist (Medical): No    Lack of Transportation (Non-Medical): No  Physical Activity: Sufficiently  Active (12/26/2022)   Exercise Vital Sign    Days of Exercise per Week: 7 days    Minutes of Exercise per Session: 30 min  Stress: No Stress Concern Present (12/26/2022)   [place]    Feeling of Stress : Not at all  Social Connections: Not on file    Tobacco Counseling Counseling given: Not Answered   Clinical Intake:  Pre-visit preparation completed: Yes  Pain : No/denies pain     Nutritional Status: BMI > 30  Obese Nutritional Risks: None Diabetes: No  How often do you need to have someone help you when you read instructions, pamphlets, or other written materials from your doctor or pharmacy?: 1 - Never  Diabetic? no  Interpreter Needed?: No  Information entered by :: NAllen LPN   Activities of Daily Living    12/26/2022    8:16 AM  In your present state of health, do you have any difficulty performing the following activities:  Hearing? 0  Vision? 0  Difficulty concentrating or making decisions? 0  Walking or climbing stairs? 0  Dressing or bathing? 0  Doing errands, shopping? 0  Preparing Food and eating ? N  Using the Toilet? N  In the past six months, have you accidently leaked urine? N  Do you have problems with loss of bowel control? N  Managing your Medications? N  Managing your Finances? N  Housekeeping or managing your Housekeeping? N    Patient Care Team: Tysinger, Camelia Eng, PA-C as PCP - General (Family Medicine) Sueanne Margarita, MD as PCP - Cardiology (Cardiology)  Indicate any recent Medical Services you may have received from other than [facility] providers in the past year (date may be approximate).     Assessment:   This is a routine wellness examination for [name].  Hearing/Vision screen Vision Screening - Comments:: No regular eye exams,   Dietary issues and exercise activities discussed: Current Exercise Habits: Home exercise routine, Type of exercise: Other - see  comments (stationary bike), Time (Minutes): 30, Frequency (Times/Week): 7, Weekly Exercise (Minutes/Week): 210   Goals Addressed             This Visit's Progress    Patient Stated       12/26/2022, wants to exercise more, start walking and eat a better diet       Depression Screen    12/26/2022    8:15 AM 03/06/2022   11:36 AM 12/23/2021    8:17 AM 01/27/2021    8:36 AM 10/07/2019  9:05 AM 09/04/2019   11:07 AM  PHQ 2/9 Scores  PHQ - 2 Score 0 0 0 0 0 0  PHQ- 9 Score 0         Fall Risk    12/26/2022    8:15 AM 03/06/2022   11:36 AM 12/23/2021    8:17 AM 01/27/2021    8:36 AM 10/07/2019    9:05 AM  Fall Risk   Falls in the past year? 0 0 0 0 0  Number falls in past yr: 0 0     Injury with Fall? 0 0     Risk for fall due to : Medication side effect No Fall Risks Medication side effect No Fall Risks   Follow up Falls prevention discussed;Education provided;Falls evaluation completed Falls evaluation completed Falls evaluation completed;Education provided;Falls prevention discussed Falls evaluation completed     FALL RISK PREVENTION PERTAINING TO THE HOME:  Any stairs in or around the home? Yes  If so, are there any without handrails? Yes  Home free of loose throw rugs in walkways, pet beds, electrical cords, etc? Yes  Adequate lighting in your home to reduce risk of falls? Yes   ASSISTIVE DEVICES UTILIZED TO PREVENT FALLS:  Life alert? No  Use of a cane, walker or w/c? No  Grab bars in the bathroom? Yes  Shower chair or bench in shower? Yes  Elevated toilet seat or a handicapped toilet? Yes   TIMED UP AND GO:  Was the test performed? No .      Cognitive Function:        12/26/2022    8:17 AM 12/23/2021    8:19 AM  6CIT Screen  What Year? 0 points 0 points  What month? 0 points 0 points  What time? 0 points 0 points  Count back from 20 0 points 0 points  Months in reverse 0 points 0 points  Repeat phrase 0 points 0 points  Total Score 0 points 0  points    Immunizations Immunization History  Administered Date(s) Administered   Influenza, High Dose Seasonal PF 08/22/2019   PFIZER(Purple Top)SARS-COV-2 Vaccination 12/25/2019, 01/19/2020, 08/16/2020   Pneumococcal Conjugate-13 10/07/2019   Pneumococcal Polysaccharide-23 01/27/2021   Zoster Recombinat (Shingrix) 07/21/2022, 10/13/2022    TDAP status: Due, Education has been provided regarding the importance of this vaccine. Advised may receive this vaccine at local pharmacy or Health Dept. Aware to provide a copy of the vaccination record if obtained from local pharmacy or Health Dept. Verbalized acceptance and understanding.  Flu Vaccine status: Declined, Education has been provided regarding the importance of this vaccine but patient still declined. Advised may receive this vaccine at local pharmacy or Health Dept. Aware to provide a copy of the vaccination record if obtained from local pharmacy or Health Dept. Verbalized acceptance and understanding.  Pneumococcal vaccine status: Up to date  Covid-19 vaccine status: Completed vaccines  Qualifies for Shingles Vaccine? Yes   Zostavax completed Yes   Shingrix Completed?: Yes  Screening Tests Health Maintenance  Topic Date Due   DTaP/Tdap/Td (1 - Tdap) Never done   INFLUENZA VACCINE  06/13/2022   COVID-19 Vaccine (4 - 2023-24 season) 07/14/2022   Medicare Annual Wellness (AWV)  12/27/2023   MAMMOGRAM  01/31/2024   COLONOSCOPY (Pts 45-62yr Insurance coverage will need to be confirmed)  12/07/2029   Pneumonia Vaccine 70 Years old  Completed   DEXA SCAN  Completed   Hepatitis C Screening  Completed   Zoster Vaccines- Shingrix  Completed  HPV VACCINES  Aged Out    Health Maintenance  Health Maintenance Due  Topic Date Due   DTaP/Tdap/Td (1 - Tdap) Never done   INFLUENZA VACCINE  06/13/2022   COVID-19 Vaccine (4 - 2023-24 season) 07/14/2022    Colorectal cancer screening: Type of screening: Colonoscopy. Completed  12/08/2019. Repeat every 10 years  Mammogram status: Completed 01/30/2022. Repeat every year  Bone Density status: Completed 09/19/2021.   Lung Cancer Screening: (Low Dose CT Chest recommended if Age 45-80 years, 30 pack-year currently smoking OR have quit w/in 15years.) does not qualify.   Lung Cancer Screening Referral: no  Additional Screening:  Hepatitis C Screening: does qualify; Completed 10/07/2019  Vision Screening: Recommended annual ophthalmology exams for early detection of glaucoma and other disorders of the eye. Is the patient up to date with their annual eye exam?  No  Who is the provider or what is the name of the office in which the patient attends annual eye exams? none If pt is not established with a provider, would they like to be referred to a provider to establish care? No .   Dental Screening: Recommended annual dental exams for proper oral hygiene  Community Resource Referral / Chronic Care Management: CRR required this visit?  No   CCM required this visit?  No      Plan:     I have personally reviewed and noted the following in the patient's chart:   Medical and social history Use of alcohol, tobacco or illicit drugs  Current medications and supplements including opioid prescriptions. Patient is not currently taking opioid prescriptions. Functional ability and status Nutritional status Physical activity Advanced directives List of other physicians Hospitalizations, surgeries, and ER visits in previous 12 months Vitals Screenings to include cognitive, depression, and falls Referrals and appointments  In addition, I have reviewed and discussed with patient certain preventive protocols, quality metrics, and best practice recommendations. A written personalized care plan for preventive services as well as general preventive health recommendations were provided to patient.     Kellie Simmering, LPN   [id number]   Nurse Notes: none  Due to this being  a virtual visit, the after visit summary with patients personalized plan was offered to patient via mail or my-chart. Patient would like to access on my-chart

## 2022-12-29 ENCOUNTER — Ambulatory Visit: Payer: Medicare PPO

## 2023-02-04 ENCOUNTER — Other Ambulatory Visit: Payer: Self-pay | Admitting: Medical

## 2023-02-16 ENCOUNTER — Other Ambulatory Visit: Payer: Self-pay | Admitting: Medical

## 2023-03-09 ENCOUNTER — Other Ambulatory Visit: Payer: Self-pay | Admitting: Medical

## 2023-03-28 ENCOUNTER — Encounter: Payer: Self-pay | Admitting: Medical

## 2023-03-28 ENCOUNTER — Ambulatory Visit: Payer: Medicare PPO | Admitting: Medical

## 2023-03-28 VITALS — BP 154/108 | HR 65 | Temp 97.6°F | Resp 16 | Ht 69.0 in | Wt 247.8 lb

## 2023-03-28 DIAGNOSIS — Z7189 Other specified counseling: Secondary | ICD-10-CM | POA: Diagnosis not present

## 2023-03-28 DIAGNOSIS — Z0000 Encounter for general adult medical examination without abnormal findings: Secondary | ICD-10-CM | POA: Diagnosis not present

## 2023-03-28 DIAGNOSIS — Z780 Asymptomatic menopausal state: Secondary | ICD-10-CM

## 2023-03-28 DIAGNOSIS — Z87891 Personal history of nicotine dependence: Secondary | ICD-10-CM

## 2023-03-28 DIAGNOSIS — I10 Essential (primary) hypertension: Secondary | ICD-10-CM | POA: Diagnosis not present

## 2023-03-28 DIAGNOSIS — M8580 Other specified disorders of bone density and structure, unspecified site: Secondary | ICD-10-CM

## 2023-03-28 DIAGNOSIS — Z7185 Encounter for immunization safety counseling: Secondary | ICD-10-CM

## 2023-03-28 DIAGNOSIS — Z90710 Acquired absence of both cervix and uterus: Secondary | ICD-10-CM | POA: Diagnosis not present

## 2023-03-28 DIAGNOSIS — E7800 Pure hypercholesterolemia, unspecified: Secondary | ICD-10-CM | POA: Diagnosis not present

## 2023-03-28 DIAGNOSIS — R7301 Impaired fasting glucose: Secondary | ICD-10-CM

## 2023-03-28 DIAGNOSIS — Z8542 Personal history of malignant neoplasm of other parts of uterus: Secondary | ICD-10-CM

## 2023-03-28 DIAGNOSIS — Z923 Personal history of irradiation: Secondary | ICD-10-CM

## 2023-03-28 DIAGNOSIS — Z800 Family history of malignant neoplasm of digestive organs: Secondary | ICD-10-CM

## 2023-03-28 DIAGNOSIS — E2839 Other primary ovarian failure: Secondary | ICD-10-CM

## 2023-03-28 LAB — COMPREHENSIVE METABOLIC PANEL
ALT: 18 IU/L (ref 0–32)
AST: 19 IU/L (ref 0–40)
Albumin/Globulin Ratio: 2.3 — ABNORMAL HIGH (ref 1.2–2.2)
Albumin: 4.6 g/dL (ref 3.9–4.9)
Alkaline Phosphatase: 79 IU/L (ref 44–121)
BUN/Creatinine Ratio: 12 (ref 12–28)
BUN: 12 mg/dL (ref 8–27)
Bilirubin Total: 0.5 mg/dL (ref 0.0–1.2)
CO2: 21 mmol/L (ref 20–29)
Calcium: 9.8 mg/dL (ref 8.7–10.3)
Chloride: 106 mmol/L (ref 96–106)
Creatinine, Ser: 1 mg/dL (ref 0.57–1.00)
Globulin, Total: 2 g/dL (ref 1.5–4.5)
Glucose: 102 mg/dL — ABNORMAL HIGH (ref 70–99)
Potassium: 5.2 mmol/L (ref 3.5–5.2)
Sodium: 143 mmol/L (ref 134–144)
Total Protein: 6.6 g/dL (ref 6.0–8.5)
eGFR: 61 mL/min/{1.73_m2} (ref >59)

## 2023-03-28 LAB — CBC
Hematocrit: 45.5 % (ref 34.0–46.6)
Hemoglobin: 15.3 g/dL (ref 11.1–15.9)
MCH: 31.2 pg (ref 26.6–33.0)
MCHC: 33.6 g/dL (ref 31.5–35.7)
MCV: 93 fL (ref 79–97)
Platelets: 210 10*3/uL (ref 150–450)
RBC: 4.9 x10E6/uL (ref 3.77–5.28)
RDW: 12.5 % (ref 11.7–15.4)
WBC: 6.4 10*3/uL (ref 3.4–10.8)

## 2023-03-28 LAB — POCT URINALYSIS DIP (PROADVANTAGE DEVICE)
Bilirubin, UA: NEGATIVE
Blood, UA: NEGATIVE
Glucose, UA: NEGATIVE mg/dL
Ketones, POC UA: NEGATIVE mg/dL
Leukocytes, UA: NEGATIVE
Nitrite, UA: NEGATIVE
Protein Ur, POC: NEGATIVE mg/dL
Specific Gravity, Urine: 1.01
Urobilinogen, Ur: 0.2
pH, UA: 7 (ref 5.0–8.0)

## 2023-03-28 LAB — LIPID PANEL
Chol/HDL Ratio: 2.6 ratio (ref 0.0–4.4)
Cholesterol, Total: 111 mg/dL (ref 100–199)
HDL: 42 mg/dL (ref >39)
LDL Chol Calc (NIH): 50 mg/dL (ref 0–99)
Triglycerides: 102 mg/dL (ref 0–149)
VLDL Cholesterol Cal: 19 mg/dL (ref 5–40)

## 2023-03-28 LAB — HEMOGLOBIN A1C
Est. average glucose Bld gHb Est-mCnc: 108 mg/dL
Hgb A1c MFr Bld: 5.4 % (ref 4.8–5.6)

## 2023-03-28 NOTE — Progress Notes (Signed)
Subjective:   HPI  [name] is a 72 y.o. female who presents for Chief Complaint  Patient presents with   Follow-up    Medication recheck.     Patient Care Team: Eshaal Duby, Kermit Balo, PA-C as PCP - General (Family Medicine) Quintella Reichert, MD as PCP - Cardiology (Cardiology) [facility]   Concerns: None, doing fine.    Exercises with spin cycle except in winter.    Hx/o total hysterectomy, 2012  Compliant with BP and cholesterol medication.  Gets white coat hypertension.   Recent home BPs are all normal to low as reviewed.  Brother passed kidney disease 10/203  Reviewed their medical, surgical, family, social, medication, and allergy history and updated chart as appropriate.  Past Medical History:  Diagnosis Date   Allergy    Arthritis    Blindness of left eye    partial blindness   Endometrial adenocarcinoma (HCC)    Stage IB grade 2   Family history of malignant hyperthermia    brother   History of brachytherapy Aug. 8, 14, 17, 29 Sept. 4   Intracavitary/Iridium 192  (3000 cGy)   History of radiation therapy 06/20/13/17/29/13&07/17/12   HDR endometrial adenocarcinoma   Hyperlipidemia    Hypertension 08/2019   Obesity    Osteopenia    Retinal detachment     Family History  Problem Relation Age of Onset   Colon cancer Mother    Cancer Mother        colon   Aneurysm Father    Heart disease Father 56       MI vs aneurysm   Colon cancer Brother    Kidney disease Brother    Kidney failure Other    Cancer Brother        colon     Current Outpatient Medications:    Ascorbic Acid (VITAMIN C) 1000 MG tablet, , Disp: , Rfl:    atenolol (TENORMIN) 50 MG tablet, TAKE 1 TABLET BY MOUTH EVERY DAY, Disp: 90 tablet, Rfl: 1   Calcium Carbonate (CALCIUM 600 PO), Take 1 tablet by mouth daily., Disp: , Rfl:    D-1000 EXTRA STRENGTH 25 MCG (1000 UT) tablet, TAKE 1 TABLET BY MOUTH EVERY DAY, Disp: 90 tablet, Rfl: 3   Multiple Vitamins-Minerals  (CENTRUM ADULTS) TABS, , Disp: , Rfl:    rosuvastatin (CRESTOR) 10 MG tablet, TAKE 1 TABLET BY MOUTH EVERY DAY, Disp: 90 tablet, Rfl: 0  No Known Allergies    Review of Systems Constitutional: -fever, -chills, -sweats, -unexpected weight change, -decreased appetite, -fatigue Allergy: -sneezing, -itching, -congestion Dermatology: -changing moles, --rash, -lumps ENT: -runny nose, -ear pain, -sore throat, -hoarseness, -sinus pain, -teeth pain, - ringing in ears, -hearing loss, -nosebleeds Cardiology: -chest pain, -palpitations, -swelling, -difficulty breathing when lying flat, -waking up short of breath Respiratory: -cough, -shortness of breath, -difficulty breathing with exercise or exertion, -wheezing, -coughing up blood Gastroenterology: -abdominal pain, -nausea, -vomiting, -diarrhea, -constipation, -blood in stool, -changes in bowel movement, -difficulty swallowing or eating Hematology: -bleeding, -bruising  Musculoskeletal: -joint aches, -muscle aches, -joint swelling, -back pain, -neck pain, -cramping, -changes in gait Ophthalmology: denies vision changes, eye redness, itching, discharge Urology: -burning with urination, -difficulty urinating, -blood in urine, -urinary frequency, -urgency, -incontinence Neurology: -headache, -weakness, -tingling, -numbness, -memory loss, -falls, -dizziness Psychology: -depressed mood, -agitation, -sleep problems Breast/gyn: -breast tendnerss, -discharge, -lumps, -vaginal discharge,- irregular periods, -heavy periods      12/26/2022    8:15 AM 03/06/2022   11:36 AM 12/23/2021  8:17 AM 01/27/2021    8:36 AM 10/07/2019    9:05 AM  Depression screen PHQ 2/9  Decreased Interest 0 0 0 0 0  Down, Depressed, Hopeless 0 0 0 0 0  PHQ - 2 Score 0 0 0 0 0  Altered sleeping 0      Tired, decreased energy 0      Change in appetite 0      Feeling bad or failure about yourself  0      Trouble concentrating 0      Moving slowly or fidgety/restless 0       Suicidal thoughts 0      PHQ-9 Score 0      Difficult doing work/chores Not difficult at all           Objective:  BP (!) 154/108 (BP Location: Left Arm, Cuff Size: Large)   Pulse 65   Temp 97.6 F (36.4 C) (Oral)   Resp 16   Ht 5\' 9"  (1.753 m)   Wt 247 lb 12.8 oz (112.4 kg)   SpO2 96% Comment: room air  BMI 36.59 kg/m   General appearance: alert, no distress, WD/WN, Caucasian female Skin: scattered macules, no worrisome lesions HEENT: normocephalic, conjunctiva/corneas normal, sclerae anicteric, PERRLA, EOMi Neck: supple, no lymphadenopathy, no thyromegaly, no masses, normal ROM, no bruits Chest: non tender, normal shape and expansion Heart: RRR, normal S1, S2, no murmurs Lungs: CTA bilaterally, no wheezes, rhonchi, or rales Abdomen: +bs, soft, non tender, non distended, no masses, no hepatomegaly, no splenomegaly, no bruits Back: non tender, normal ROM, no scoliosis Musculoskeletal: upper extremities non tender, no obvious deformity, normal ROM throughout, lower extremities non tender, no obvious deformity, normal ROM throughout Extremities: no edema, no cyanosis, no clubbing Pulses: 2+ symmetric, upper and lower extremities, normal cap refill Neurological: alert, oriented x 3, CN2-12 intact, strength normal upper extremities and lower extremities, sensation normal throughout, DTRs 2+ throughout, no cerebellar signs, gait normal Psychiatric: normal affect, behavior normal, pleasant  Breast/gyn/rectal - deferred /declined   Assessment and Plan :   Encounter Diagnoses  Name Primary?   Encounter for health maintenance examination in adult Yes   Advanced directives, counseling/discussion    Osteopenia, unspecified location    Impaired fasting blood sugar    Essential hypertension, benign    Vaccine counseling    S/P hysterectomy    Post-menopausal    Pure hypercholesterolemia    History of radiation therapy    History of endometrial cancer    History of brachytherapy     Former smoker    Family history of colon cancer    Estrogen deficiency       This visit was a preventative care visit, also known as wellness visit or routine physical.   Topics typically include healthy lifestyle, diet, exercise, preventative care, vaccinations, sick and well care, proper use of emergency dept and after hours care, as well as other concerns.     Recommendations: Continue to return yearly for your annual wellness and preventative care visits.  This gives [place] a chance to discuss healthy lifestyle, exercise, vaccinations, review your chart record, and perform screenings where appropriate.  I recommend you see your eye doctor yearly for routine vision care.  I recommend you see your dentist yearly for routine dental care including hygiene visits twice yearly.   Vaccination recommendations were reviewed Immunization History  Administered Date(s) Administered   Influenza, High Dose Seasonal PF 08/22/2019   PFIZER(Purple Top)SARS-COV-2 Vaccination 12/25/2019, 01/19/2020, 08/16/2020  Pneumococcal Conjugate-13 10/07/2019   Pneumococcal Polysaccharide-23 01/27/2021   Zoster Recombinat (Shingrix) 07/21/2022, 10/13/2022    I recommend you get tetanus Tdap vaccine at the pharmacy.   Screening for cancer: Colon cancer screening: I reviewed your colonoscopy on file that is up to date . Due repeat colonoscopy 2026  Breast cancer screening: You should perform a self breast exam monthly.   We reviewed recommendations for regular mammograms and breast cancer screening.  Cervical cancer screening: Status post total hysterectomy   Skin cancer screening: Check your skin regularly for new changes, growing lesions, or other lesions of concern Come in for evaluation if you have skin lesions of concern.  Lung cancer screening: If you have a greater than 20 pack year history of tobacco use, then you may qualify for lung cancer screening with a chest CT scan.   Please call  your insurance company to inquire about coverage for this test.  We currently don't have screenings for other cancers besides breast, cervical, colon, and lung cancers.  If you have a strong family history of cancer or have other cancer screening concerns, please let me know.    Bone health: Get at least 150 minutes of aerobic exercise weekly Get weight bearing exercise at least once weekly Bone density test:  A bone density test is an imaging test that uses a type of X-ray to measure the amount of calcium and other minerals in your bones. The test may be used to diagnose or screen you for a condition that causes weak or thin bones (osteoporosis), predict your risk for a broken bone (fracture), or determine how well your osteoporosis treatment is working. The bone density test is recommended for females 65 and older, or females or males <65 if certain risk factors such as thyroid disease, long term use of steroids such as for asthma or rheumatological issues, vitamin D deficiency, estrogen deficiency, family history of osteoporosis, self or family history of fragility fracture in first degree relative.  Counseled on weightbearing exercise.  I reviewed her bone density test from 2022 that showed osteopenia.  Lets plan repeat bone density test 09/2023  Please call to schedule your bone density test.   [facility]  [phone] [address], [address] [place], [place] [address]   Heart health: Get at least 150 minutes of aerobic exercise weekly Limit alcohol It is important to maintain a healthy blood pressure and healthy cholesterol numbers  Heart disease screening: Screening for heart disease includes screening for blood pressure, fasting lipids, glucose/diabetes screening, BMI height to weight ratio, reviewed of smoking status, physical activity, and diet.    Goals include blood pressure 120/80 or less, maintaining a healthy lipid/cholesterol profile,  preventing diabetes or keeping diabetes numbers under good control, not smoking or using tobacco products, exercising most days per week or at least 150 minutes per week of exercise, and eating healthy variety of fruits and vegetables, healthy oils, and avoiding unhealthy food choices like fried food, fast food, high sugar and high cholesterol foods.    I reviewed her CT coronary calcium score of 0 May 2022.   Medical care options: I recommend you continue to seek care here first for routine care.  We try really hard to have available appointments Monday through Friday daytime hours for sick visits, acute visits, and physicals.  Urgent care should be used for after hours and weekends for significant issues that cannot wait till the next day.  The emergency department should  be used for significant potentially life-threatening emergencies.  The emergency department is expensive, can often have long wait times for less significant concerns, so try to utilize primary care, urgent care, or telemedicine when possible to avoid unnecessary trips to the emergency department.  Virtual visits and telemedicine have been introduced since the pandemic started in 2020, and can be convenient ways to receive medical care.  We offer virtual appointments as well to assist you in a variety of options to seek medical care.   Advanced Directives: I recommend you consider completing a Health Care Power of Attorney and Living Will.   These documents respect your wishes and help alleviate burdens on your loved ones if you were to become terminally ill or be in a position to need those documents enforced.    You can complete Advanced Directives yourself, have them notarized, then have copies made for our office, for you and for anybody you feel should have them in safe keeping.  Or, you can have an attorney prepare these documents.   If you haven't updated your Last Will and Testament in a while, it may be worthwhile having an  attorney prepare these documents together and save on some costs.       Separate significant issues discussed:  Hyperlipidemia - compliant, labs today  HTN with white coat hypertension - labs today, continue current medicaiton.   Home BPs are fine despite initial high reading here.   [name] was seen today for follow-up.  Diagnoses and all orders for this visit:  Encounter for health maintenance examination in adult -     Comprehensive metabolic panel -     CBC -     Lipid panel -     Hemoglobin A1c -     POCT Urinalysis DIP (Proadvantage Device) -     DG Bone Density; Future  Advanced directives, counseling/discussion  Osteopenia, unspecified location -     DG Bone Density; Future  Impaired fasting blood sugar -     Hemoglobin A1c  Essential hypertension, benign  Vaccine counseling  S/P hysterectomy  Post-menopausal -     DG Bone Density; Future  Pure hypercholesterolemia -     Lipid panel  History of radiation therapy  History of endometrial cancer  History of brachytherapy  Former smoker  Family history of colon cancer  Estrogen deficiency    Follow-up pending labs, yearly for physical

## 2023-03-29 ENCOUNTER — Other Ambulatory Visit: Payer: Self-pay | Admitting: Medical

## 2023-03-29 MED ORDER — ATENOLOL 50 MG PO TABS: 50.0000 mg | ORAL_TABLET | Freq: Every day | ORAL | 3 refills | Status: DC

## 2023-03-29 MED ORDER — ROSUVASTATIN CALCIUM 10 MG PO TABS: 10.0000 mg | ORAL_TABLET | Freq: Every day | ORAL | 3 refills | Status: DC

## 2023-03-29 NOTE — Progress Notes (Signed)
Results sent through MyChart

## 2023-04-03 DIAGNOSIS — L578 Other skin changes due to chronic exposure to nonionizing radiation: Secondary | ICD-10-CM | POA: Diagnosis not present

## 2023-09-19 ENCOUNTER — Other Ambulatory Visit: Payer: Self-pay | Admitting: Medical

## 2023-09-19 DIAGNOSIS — Z780 Asymptomatic menopausal state: Secondary | ICD-10-CM

## 2023-09-19 DIAGNOSIS — M8580 Other specified disorders of bone density and structure, unspecified site: Secondary | ICD-10-CM

## 2023-09-19 DIAGNOSIS — Z0000 Encounter for general adult medical examination without abnormal findings: Secondary | ICD-10-CM

## 2023-09-27 ENCOUNTER — Ambulatory Visit: Payer: Medicare PPO | Admitting: Medical

## 2023-09-27 VITALS — BP 120/80 | HR 64 | Wt 241.8 lb

## 2023-09-27 DIAGNOSIS — E7800 Pure hypercholesterolemia, unspecified: Secondary | ICD-10-CM

## 2023-09-27 DIAGNOSIS — M791 Myalgia, unspecified site: Secondary | ICD-10-CM | POA: Diagnosis not present

## 2023-09-27 DIAGNOSIS — Z23 Encounter for immunization: Secondary | ICD-10-CM | POA: Diagnosis not present

## 2023-09-27 DIAGNOSIS — I10 Essential (primary) hypertension: Secondary | ICD-10-CM

## 2023-09-27 DIAGNOSIS — M722 Plantar fascial fibromatosis: Secondary | ICD-10-CM | POA: Diagnosis not present

## 2023-09-27 DIAGNOSIS — R7301 Impaired fasting glucose: Secondary | ICD-10-CM | POA: Diagnosis not present

## 2023-09-27 NOTE — Progress Notes (Signed)
Subjective:  [name] is a 72 y.o. female who presents for Chief Complaint  Patient presents with   Medical Management of Chronic Issues    6 month follow-up. Waking up with achy legs and not sure if side effect from statin drug. BP at home is running around 103-120/58-78     Here for recheck.  Last visit she had elevated blood sugars putting her at risk for diabetes.  She has been trying to eat healthy.  She exercises every day.  She feels like she has got a good handle on this.  Hypertension-compliant with medication.  Home blood pressure readings look good.  She has a list with her today and all the readings look normal.  Had some issues with plantar fascitis, was doing some home remedies.   This was 3 months ago, but now finally seeing improvement  She recently called for bone density test but soonest available appt is June 2025.  She has been having some pains in her ankles and feet and lower legs.  Achiness not necessarily cramps.  Sometimes this wakes her up in the morning.  She wonders if it is related to her medication.  No joint swelling.  Wants flu shot  No other aggravating or relieving factors.    No other c/o.  Past Medical History:  Diagnosis Date   Allergy    Arthritis    Blindness of left eye    partial blindness   Endometrial adenocarcinoma (HCC)    Stage IB grade 2   Family history of malignant hyperthermia    brother   History of brachytherapy Aug. 8, 14, 17, 29 Sept. 4   Intracavitary/Iridium 192  (3000 cGy)   History of radiation therapy 06/20/13/17/29/13&07/17/12   HDR endometrial adenocarcinoma   Hyperlipidemia    Hypertension 08/2019   Obesity    Osteopenia    Retinal detachment    Current Outpatient Medications on File Prior to Visit  Medication Sig Dispense Refill   Ascorbic Acid (VITAMIN C) 1000 MG tablet      atenolol (TENORMIN) 50 MG tablet Take 1 tablet (50 mg total) by mouth daily. 90 tablet 3   Calcium Carbonate (CALCIUM 600  PO) Take 1 tablet by mouth daily.     D-1000 EXTRA STRENGTH 25 MCG (1000 UT) tablet TAKE 1 TABLET BY MOUTH EVERY DAY 90 tablet 3   Multiple Vitamins-Minerals (CENTRUM ADULTS) TABS      rosuvastatin (CRESTOR) 10 MG tablet Take 1 tablet (10 mg total) by mouth daily. 90 tablet 3   No current facility-administered medications on file prior to visit.   Family History  Problem Relation Age of Onset   Colon cancer Mother    Cancer Mother        colon   Aneurysm Father    Heart disease Father 61       MI vs aneurysm   Colon cancer Brother    Kidney disease Brother    Kidney failure Other    Cancer Brother        colon     The following portions of the patient's history were reviewed and updated as appropriate: allergies, current medications, past family history, past medical history, past social history, past surgical history and problem list.  ROS Otherwise as in subjective above    Objective: BP 120/80   Pulse 64   Wt 241 lb 12.8 oz (109.7 kg)   BMI 35.71 kg/m   Wt Readings from Last 3 Encounters:  09/27/23  241 lb 12.8 oz (109.7 kg)  03/28/23 247 lb 12.8 oz (112.4 kg)  12/26/22 244 lb (110.7 kg)   BP Readings from Last 3 Encounters:  09/27/23 120/80  03/28/23 (!) 154/108  03/06/22 130/70   General appearance: alert, no distress, well developed, well nourished Neck: supple, no lymphadenopathy, no thyromegaly, no masses Heart: RRR, normal S1, S2, no murmurs Lungs: CTA bilaterally, no wheezes, rhonchi, or rales Pulses: 2+ radial pulses, 2+ pedal pulses, normal cap refill Ext: no edema Good arches of feet nontender today, normal range of motion of foot and ankle, legs nontender     Assessment: Encounter Diagnoses  Name Primary?   Essential hypertension, benign Yes   Needs flu shot    Pure hypercholesterolemia    Impaired fasting blood sugar    Plantar fasciitis    Myalgia      Plan: Impaired fasting glucose Updated labs today Continue with exercise as  you are doing and healthy eating habits  Hypertension Home blood pressure readings look excellent Continue atenolol 50 mg daily  Myalgias and pains in the legs We discussed possible causes Stop the statin 2 weeks and see if things go away.  Let me know either way if improved in 2 weeks Labs today to further evaluate There is also an ABI blood flow screening to be done if the labs are fine and if symptoms do not improve  Hyperlipidemia Temporarily stop your Crestor rosuvastatin for 2 weeks to see if the leg aches and pains go away.  If they resolve then we may need to find a different option or go 6 weeks without medicines and recheck fasting labs Fortunately your CT coronary heart test a few years ago did not show any atherosclerosis  Discussed exam findings, symptoms, and etiology appears to be plantar fascitis and pes planus.  Discussed diagnosis, treatment recommendations, and follow up.     Plantar fascitis Glad your symptoms are improved Avoid going barefoot since your feet flatten out without good arch support Follow-up if this continues to be a problem  Counseled on the influenza virus vaccine.  Vaccine information sheet given.  Influenza vaccine given after consent obtained.    Bruce "Eunice Blase" was seen today for medical management of chronic issues.  Diagnoses and all orders for this visit:  Essential hypertension, benign -     Comprehensive metabolic panel  Needs flu shot -     Flu Vaccine Trivalent High Dose (Fluad)  Pure hypercholesterolemia  Impaired fasting blood sugar -     Hemoglobin A1c  Plantar fasciitis  Myalgia -     Comprehensive metabolic panel    Follow up: pending labs

## 2023-09-28 LAB — COMPREHENSIVE METABOLIC PANEL
ALT: 22 [IU]/L (ref 0–32)
AST: 21 [IU]/L (ref 0–40)
Albumin: 4.6 g/dL (ref 3.9–4.9)
Alkaline Phosphatase: 79 [IU]/L (ref 44–121)
BUN/Creatinine Ratio: 13 (ref 12–28)
BUN: 12 mg/dL (ref 8–27)
Bilirubin Total: 0.6 mg/dL (ref 0.0–1.2)
CO2: 21 mmol/L (ref 20–29)
Calcium: 9.8 mg/dL (ref 8.7–10.3)
Chloride: 107 mmol/L — ABNORMAL HIGH (ref 96–106)
Creatinine, Ser: 0.92 mg/dL (ref 0.57–1.00)
Globulin, Total: 2.2 g/dL (ref 1.5–4.5)
Glucose: 101 mg/dL — ABNORMAL HIGH (ref 70–99)
Potassium: 4.3 mmol/L (ref 3.5–5.2)
Sodium: 143 mmol/L (ref 134–144)
Total Protein: 6.8 g/dL (ref 6.0–8.5)
eGFR: 67 mL/min/{1.73_m2} (ref >59)

## 2023-09-28 LAB — HEMOGLOBIN A1C
Est. average glucose Bld gHb Est-mCnc: 108 mg/dL
Hgb A1c MFr Bld: 5.4 % (ref 4.8–5.6)

## 2023-09-28 NOTE — Progress Notes (Signed)
Results sent through MyChart

## 2023-12-31 ENCOUNTER — Other Ambulatory Visit: Payer: Self-pay | Admitting: Medical

## 2023-12-31 DIAGNOSIS — Z0000 Encounter for general adult medical examination without abnormal findings: Secondary | ICD-10-CM

## 2024-01-01 ENCOUNTER — Ambulatory Visit: Payer: Medicare PPO

## 2024-01-01 DIAGNOSIS — Z0000 Encounter for general adult medical examination without abnormal findings: Secondary | ICD-10-CM | POA: Diagnosis not present

## 2024-01-01 NOTE — Patient Instructions (Signed)
 Ms. Boer , Thank you for taking time to come for your Medicare Wellness Visit. I appreciate your ongoing commitment to your health goals. Please review the following plan we discussed and let me know if I can assist you in the future.   Referrals/Orders/Follow-Ups/Clinician Recommendations: none  This is a list of the screening recommended for you and due dates:  Health Maintenance  Topic Date Due   COVID-19 Vaccine (4 - 2024-25 season) 07/15/2023   Mammogram  01/31/2024   Medicare Annual Wellness Visit  12/31/2024   Colon Cancer Screening  12/07/2029   Pneumonia Vaccine  Completed   Flu Shot  Completed   DEXA scan (bone density measurement)  Completed   Hepatitis C Screening  Completed   Zoster (Shingles) Vaccine  Completed   HPV Vaccine  Aged Out   DTaP/Tdap/Td vaccine  Discontinued    Advanced directives: (Copy Requested) Please bring a copy of your health care power of attorney and living will to the office to be added to your chart at your convenience.  Next Medicare Annual Wellness Visit scheduled for next year: Yes  insert Preventive Care attachment Insert FALL PREVENTION attachment if needed

## 2024-01-01 NOTE — Progress Notes (Signed)
 Subjective:   [name] is a 73 y.o. female who presents for Medicare Annual (Subsequent) preventive examination.  Visit Complete: Virtual I connected with  [name] on 01/01/24 by a audio enabled telemedicine application and verified that I am speaking with the correct person using two identifiers. Interactive audio and video telecommunications were attempted between this provider and patient, however failed, due to patient having technical difficulties OR patient did not have access to video capability.  We continued and completed visit with audio only.  Patient Location: Home  Provider Location: Office/Clinic  I discussed the limitations of evaluation and management by telemedicine. The patient expressed understanding and agreed to proceed.  Vital Signs: Because this visit was a virtual/telehealth visit, some criteria may be missing or patient reported. Any vitals not documented were not able to be obtained and vitals that have been documented are patient reported.  Patient Medicare AWV questionnaire was completed by the patient on 12/26/2023; I have confirmed that all information answered by patient is correct and no changes since this date.  Cardiac Risk Factors include: advanced age (>44men, >67 women);dyslipidemia;hypertension     Objective:    Today's Vitals   There is no height or weight on file to calculate BMI.     01/01/2024    8:10 AM 12/26/2022    8:14 AM 12/23/2021    8:16 AM 10/07/2019    9:11 AM  Advanced Directives  Does Patient Have a Medical Advance Directive? Yes No No No  Type of Estate agent of [place];Living will     Copy of Healthcare Power of Attorney in Chart? No - copy requested       Current Medications (verified) Outpatient Encounter Medications as of 01/01/2024  Medication Sig   Ascorbic Acid (VITAMIN C) 1000 MG tablet    atenolol (TENORMIN) 50 MG tablet Take 1 tablet (50 mg total) by mouth daily.    Calcium Carbonate (CALCIUM 600 PO) Take 1 tablet by mouth daily.   D-1000 EXTRA STRENGTH 25 MCG (1000 UT) tablet TAKE 1 TABLET BY MOUTH EVERY DAY   Multiple Vitamins-Minerals (CENTRUM ADULTS) TABS    rosuvastatin (CRESTOR) 10 MG tablet Take 1 tablet (10 mg total) by mouth daily.   No facility-administered encounter medications on file as of 01/01/2024.    Allergies (verified) Patient has no known allergies.   History: Past Medical History:  Diagnosis Date   Allergy    Arthritis    Blindness of left eye    partial blindness   Endometrial adenocarcinoma (HCC)    Stage IB grade 2   Family history of malignant hyperthermia    brother   History of brachytherapy Aug. 8, 14, 17, 29 Sept. 4   Intracavitary/Iridium 192  (3000 cGy)   History of radiation therapy 06/20/13/17/29/13&07/17/12   HDR endometrial adenocarcinoma   Hyperlipidemia    Hypertension 08/2019   Obesity    Osteopenia    Retinal detachment    Past Surgical History:  Procedure Laterality Date   ABDOMINAL HYSTERECTOMY  05/09/11   Robotic TAH, BSO, and LND   WISDOM TOOTH EXTRACTION  1973   Family History  Problem Relation Age of Onset   Colon cancer Mother    Cancer Mother        colon   Aneurysm Father    Heart disease Father 67       MI vs aneurysm   Colon cancer Brother    Kidney disease Brother    Kidney  failure Other    Cancer Brother        colon   Social History   Socioeconomic History   Marital status: Single    Spouse name: Not on file   Number of children: Not on file   Years of education: Not on file   Highest education level: 12th grade  Occupational History   Not on file  Tobacco Use   Smoking status: Former    Current packs/day: 0.00    Average packs/day: 0.5 packs/day for 30.0 years (15.0 ttl pk-yrs)    Types: Cigarettes    Start date: 01/11/1981    Quit date: 01/12/2011    Years since quitting: 12.9   Smokeless tobacco: Never  Vaping Use   Vaping status: Never Used  Substance and  Sexual Activity   Alcohol use: Not Currently    Comment: rarely   Drug use: No   Sexual activity: Never  Other Topics Concern   Not on file  Social History Narrative   Lives alone.   Retired, former Manufacturing systems engineer.  No exercise.   01/2021   Social Drivers of Health   Financial Resource Strain: Low Risk  (01/01/2024)   Overall Financial Resource Strain (CARDIA)    Difficulty of Paying Living Expenses: Not hard at all  Food Insecurity: No Food Insecurity (01/01/2024)   Hunger Vital Sign    Worried About Running Out of Food in the Last Year: Never true    Ran Out of Food in the Last Year: Never true  Transportation Needs: No Transportation Needs (01/01/2024)   PRAPARE - Administrator, Civil Service (Medical): No    Lack of Transportation (Non-Medical): No  Physical Activity: Sufficiently Active (01/01/2024)   Exercise Vital Sign    Days of Exercise per Week: 7 days    Minutes of Exercise per Session: 30 min  Stress: No Stress Concern Present (01/01/2024)   [employer] of Occupational Health - Occupational Stress Questionnaire    Feeling of Stress : Not at all  Social Connections: Socially Isolated (01/01/2024)   Social Connection and Isolation Panel [NHANES]    Frequency of Communication with Friends and Family: Three times a week    Frequency of Social Gatherings with Friends and Family: Once a week    Attends Religious Services: Never    Database administrator or Organizations: No    Attends Engineer, structural: Never    Marital Status: Never married    Tobacco Counseling Counseling given: Not Answered   Clinical Intake:  Pre-visit preparation completed: Yes  Pain : No/denies pain     Nutritional Risks: None Diabetes: No  How often do you need to have someone help you when you read instructions, pamphlets, or other written materials from your doctor or pharmacy?: 1 - Never  Interpreter Needed?: No  Information entered by :: NAllen  LPN   Activities of Daily Living    12/26/2023   10:53 AM  In your present state of health, do you have any difficulty performing the following activities:  Hearing? 0  Vision? 0  Difficulty concentrating or making decisions? 0  Walking or climbing stairs? 0  Dressing or bathing? 0  Doing errands, shopping? 0  Preparing Food and eating ? N  Using the Toilet? N  In the past six months, have you accidently leaked urine? N  Do you have problems with loss of bowel control? N  Managing your Medications? N  Managing your Finances? N  Housekeeping or managing your Housekeeping? N    Patient Care Team: Tysinger, Kermit Balo, PA-C as PCP - General (Family Medicine) Quintella Reichert, MD as PCP - Cardiology (Cardiology)  Indicate any recent Medical Services you may have received from other than [facility] providers in the past year (date may be approximate).     Assessment:   This is a routine wellness examination for [name].  Hearing/Vision screen Hearing Screening - Comments:: Denies hearing issues Vision Screening - Comments:: No regular eye exams   Goals Addressed             This Visit's Progress    Patient Stated       01/01/2024, wants to lose weight       Depression Screen    01/01/2024    8:11 AM 09/27/2023    9:04 AM 12/26/2022    8:15 AM 03/06/2022   11:36 AM 12/23/2021    8:17 AM 01/27/2021    8:36 AM 10/07/2019    9:05 AM  PHQ 2/9 Scores  PHQ - 2 Score 0 0 0 0 0 0 0  PHQ- 9 Score 0  0        Fall Risk    12/26/2023   10:53 AM 09/27/2023    9:04 AM 12/26/2022    8:15 AM 03/06/2022   11:36 AM 12/23/2021    8:17 AM  Fall Risk   Falls in the past year? 0 0 0 0 0  Number falls in past yr: 0 0 0 0   Injury with Fall? 0 0 0 0   Risk for fall due to : Medication side effect No Fall Risks Medication side effect No Fall Risks Medication side effect  Follow up Falls evaluation completed;Falls prevention discussed Falls evaluation completed Falls prevention  discussed;Education provided;Falls evaluation completed Falls evaluation completed Falls evaluation completed;Education provided;Falls prevention discussed    MEDICARE RISK AT HOME: Medicare Risk at Home Any stairs in or around the home?: (Patient-Rptd) Yes If so, are there any without handrails?: (Patient-Rptd) Yes Home free of loose throw rugs in walkways, pet beds, electrical cords, etc?: (Patient-Rptd) No Adequate lighting in your home to reduce risk of falls?: (Patient-Rptd) Yes Life alert?: (Patient-Rptd) No Use of a cane, walker or w/c?: (Patient-Rptd) No Grab bars in the bathroom?: (Patient-Rptd) Yes Shower chair or bench in shower?: (Patient-Rptd) Yes Elevated toilet seat or a handicapped toilet?: (Patient-Rptd) Yes  TIMED UP AND GO:  Was the test performed?  No    Cognitive Function:        01/01/2024    8:12 AM 12/26/2022    8:17 AM 12/23/2021    8:19 AM  6CIT Screen  What Year? 0 points 0 points 0 points  What month? 0 points 0 points 0 points  What time? 0 points 0 points 0 points  Count back from 20 0 points 0 points 0 points  Months in reverse 0 points 0 points 0 points  Repeat phrase 0 points 0 points 0 points  Total Score 0 points 0 points 0 points    Immunizations Immunization History  Administered Date(s) Administered   Fluad Trivalent(High Dose 65+) 09/27/2023   Influenza, High Dose Seasonal PF 08/22/2019   PFIZER(Purple Top)SARS-COV-2 Vaccination 12/25/2019, 01/19/2020, 08/16/2020   Pneumococcal Conjugate-13 10/07/2019   Pneumococcal Polysaccharide-23 01/27/2021   Zoster Recombinant(Shingrix) 07/21/2022, 10/13/2022    TDAP status: Due, Education has been provided regarding the importance of this vaccine. Advised may receive this vaccine at local pharmacy or Health  Dept. Aware to provide a copy of the vaccination record if obtained from local pharmacy or Health Dept. Verbalized acceptance and understanding.  Flu Vaccine status: Up to  date  Pneumococcal vaccine status: Up to date  Covid-19 vaccine status: Information provided on how to obtain vaccines.   Qualifies for Shingles Vaccine? Yes   Zostavax completed Yes   Shingrix Completed?: Yes  Screening Tests Health Maintenance  Topic Date Due   COVID-19 Vaccine (4 - 2024-25 season) 07/15/2023   MAMMOGRAM  01/31/2024   Medicare Annual Wellness (AWV)  12/31/2024   Colonoscopy  12/07/2029   Pneumonia Vaccine 40+ Years old  Completed   INFLUENZA VACCINE  Completed   DEXA SCAN  Completed   Hepatitis C Screening  Completed   Zoster Vaccines- Shingrix  Completed   HPV VACCINES  Aged Out   DTaP/Tdap/Td  Discontinued    Health Maintenance  Health Maintenance Due  Topic Date Due   COVID-19 Vaccine (4 - 2024-25 season) 07/15/2023    Colorectal cancer screening: Type of screening: Colonoscopy. Completed 12/08/2019. Repeat every 5 years  Mammogram status: scheduled for 01/17/2024  Bone Density status: scheduled for 05/05/2024  Lung Cancer Screening: (Low Dose CT Chest recommended if Age 104-80 years, 20 pack-year currently smoking OR have quit w/in 15years.) does not qualify.   Lung Cancer Screening Referral: no  Additional Screening:  Hepatitis C Screening: does qualify; Completed 10/07/2019  Vision Screening: Recommended annual ophthalmology exams for early detection of glaucoma and other disorders of the eye. Is the patient up to date with their annual eye exam?  No  Who is the provider or what is the name of the office in which the patient attends annual eye exams? none If pt is not established with a provider, would they like to be referred to a provider to establish care? No .   Dental Screening: Recommended annual dental exams for proper oral hygiene  Diabetic Foot Exam: n/a  Community Resource Referral / Chronic Care Management: CRR required this visit?  No   CCM required this visit?  No     Plan:     I have personally reviewed and noted the  following in the patient's chart:   Medical and social history Use of alcohol, tobacco or illicit drugs  Current medications and supplements including opioid prescriptions. Patient is not currently taking opioid prescriptions. Functional ability and status Nutritional status Physical activity Advanced directives List of other physicians Hospitalizations, surgeries, and ER visits in previous 12 months Vitals Screenings to include cognitive, depression, and falls Referrals and appointments  In addition, I have reviewed and discussed with patient certain preventive protocols, quality metrics, and best practice recommendations. A written personalized care plan for preventive services as well as general preventive health recommendations were provided to patient.     Barb Merino, LPN   [id number]   After Visit Summary: (MyChart) Due to this being a telephonic visit, the after visit summary with patients personalized plan was offered to patient via MyChart   Nurse Notes: none

## 2024-01-17 ENCOUNTER — Ambulatory Visit
Admission: RE | Admit: 2024-01-17 | Discharge: 2024-01-17 | Disposition: A | Discharge status: Home / Self Care | Payer: Medicare PPO | Source: Ambulatory Visit | Attending: Medical | Admitting: Medical

## 2024-01-17 DIAGNOSIS — Z0000 Encounter for general adult medical examination without abnormal findings: Secondary | ICD-10-CM

## 2024-01-17 DIAGNOSIS — Z1231 Encounter for screening mammogram for malignant neoplasm of breast: Secondary | ICD-10-CM | POA: Diagnosis not present

## 2024-03-17 ENCOUNTER — Other Ambulatory Visit: Payer: Self-pay | Admitting: Medical

## 2024-03-17 NOTE — Telephone Encounter (Signed)
Pt has an appt later this month

## 2024-03-31 ENCOUNTER — Ambulatory Visit (INDEPENDENT_AMBULATORY_CARE_PROVIDER_SITE_OTHER): Payer: Medicare PPO | Admitting: Medical

## 2024-03-31 VITALS — BP 140/80 | HR 62 | Ht 69.5 in | Wt 243.4 lb

## 2024-03-31 DIAGNOSIS — M8580 Other specified disorders of bone density and structure, unspecified site: Secondary | ICD-10-CM

## 2024-03-31 DIAGNOSIS — E7800 Pure hypercholesterolemia, unspecified: Secondary | ICD-10-CM | POA: Diagnosis not present

## 2024-03-31 DIAGNOSIS — Z1389 Encounter for screening for other disorder: Secondary | ICD-10-CM

## 2024-03-31 DIAGNOSIS — Z7185 Encounter for immunization safety counseling: Secondary | ICD-10-CM | POA: Diagnosis not present

## 2024-03-31 DIAGNOSIS — Z0000 Encounter for general adult medical examination without abnormal findings: Secondary | ICD-10-CM

## 2024-03-31 DIAGNOSIS — Z7189 Other specified counseling: Secondary | ICD-10-CM | POA: Diagnosis not present

## 2024-03-31 DIAGNOSIS — Z780 Asymptomatic menopausal state: Secondary | ICD-10-CM

## 2024-03-31 DIAGNOSIS — Z90710 Acquired absence of both cervix and uterus: Secondary | ICD-10-CM

## 2024-03-31 DIAGNOSIS — R7301 Impaired fasting glucose: Secondary | ICD-10-CM | POA: Diagnosis not present

## 2024-03-31 DIAGNOSIS — Z6835 Body mass index (BMI) 35.0-35.9, adult: Secondary | ICD-10-CM | POA: Insufficient documentation

## 2024-03-31 DIAGNOSIS — I10 Essential (primary) hypertension: Secondary | ICD-10-CM | POA: Diagnosis not present

## 2024-03-31 DIAGNOSIS — L989 Disorder of the skin and subcutaneous tissue, unspecified: Secondary | ICD-10-CM

## 2024-03-31 LAB — LIPID PANEL

## 2024-03-31 NOTE — Progress Notes (Signed)
 Subjective:   HPI  [name] is a 72 y.o. female who presents for Chief Complaint  Patient presents with   Annual Exam    Fasting cpe, no concerns    Patient Care Team: Cleston Lautner, Christiane Cowing, PA-C as PCP - General (Family Medicine) Jacqueline Matsu, MD as PCP - Cardiology (Cardiology) [facility] D[facility]at[facility] Dr. Evangeline Hilts, GI  Concerns: Hx/o total hysterectomy, 2012  Compliant with BP medication.  Gets white coat hypertension.   Recent home BPs are all normal as reviewed.  She stopped cholesterol medication about 6 months ago due to pains, but she now realized in retrospect that she was over doing it with stationary bike.   Since she quit using the bike the pain resolved.   Has some left shoulder pain, decreased ROM.  No injury.  Started a day after flu shot few months ago.   Is improving, but not 100%.  No fall, injury or trauma.  Reviewed their medical, surgical, family, social, medication, and allergy history and updated chart as appropriate.  Past Medical History:  Diagnosis Date   Allergy    Arthritis    Blindness of left eye    partial blindness   Endometrial adenocarcinoma (HCC)    Stage IB grade 2   Family history of malignant hyperthermia    brother   History of brachytherapy Aug. 8, 14, 17, 29 Sept. 4   Intracavitary/Iridium 192  (3000 cGy)   History of radiation therapy 06/20/13/17/29/13&07/17/12   HDR endometrial adenocarcinoma   Hyperlipidemia    Hypertension 08/2019   Obesity    Osteopenia    Retinal detachment     Family History  Problem Relation Age of Onset   Colon cancer Mother    Cancer Mother        colon   Aneurysm Father    Heart disease Father 16       MI vs aneurysm   Colon cancer Brother    Kidney disease Brother    Cancer Brother        colon   Kidney failure Other    Breast cancer Neg Hx      Current Outpatient Medications:    Ascorbic Acid (VITAMIN C) 1000 MG tablet, , Disp: , Rfl:    atenolol  (TENORMIN ) 50 MG tablet,  TAKE 1 TABLET(50 MG) BY MOUTH DAILY, Disp: 90 tablet, Rfl: 0   Calcium  Carbonate (CALCIUM  600 PO), Take 1 tablet by mouth daily., Disp: , Rfl:    D-1000 EXTRA STRENGTH 25 MCG (1000 UT) tablet, TAKE 1 TABLET BY MOUTH EVERY DAY, Disp: 90 tablet, Rfl: 3   Multiple Vitamins-Minerals (CENTRUM ADULTS) TABS, , Disp: , Rfl:   No Known Allergies   Review of Systems  Constitutional:  Negative for chills, fever, malaise/fatigue and weight loss.  HENT:  Negative for congestion, ear pain, hearing loss, sore throat and tinnitus.   Eyes:  Negative for blurred vision, pain and redness.  Respiratory:  Negative for cough, hemoptysis and shortness of breath.   Cardiovascular:  Negative for chest pain, palpitations, orthopnea, claudication and leg swelling.  Gastrointestinal:  Negative for abdominal pain, blood in stool, constipation, diarrhea, nausea and vomiting.  Genitourinary:  Negative for dysuria, flank pain, frequency, hematuria and urgency.  Musculoskeletal:  Positive for joint pain. Negative for falls and myalgias.  Skin:  Negative for itching and rash.  Neurological:  Negative for dizziness, tingling, speech change, weakness and headaches.  Endo/Heme/Allergies:  Negative for polydipsia. Does not bruise/bleed easily.  Psychiatric/Behavioral:  Negative for depression and memory loss. The patient is not nervous/anxious and does not have insomnia.          03/31/2024    8:12 AM 01/01/2024    8:11 AM 09/27/2023    9:04 AM 12/26/2022    8:15 AM 03/06/2022   11:36 AM  Depression screen PHQ 2/9  Decreased Interest 0 0 0 0 0  Down, Depressed, Hopeless 0 0 0 0 0  PHQ - 2 Score 0 0 0 0 0  Altered sleeping  0  0   Tired, decreased energy  0  0   Change in appetite  0  0   Feeling bad or failure about yourself   0  0   Trouble concentrating  0  0   Moving slowly or fidgety/restless  0  0   Suicidal thoughts  0  0   PHQ-9 Score  0  0   Difficult doing work/chores  Not difficult at all  Not difficult  at all        Objective:  Pulse 62   Ht 5' 9.5" (1.765 m)   Wt 243 lb 6.4 oz (110.4 kg)   SpO2 97%   BMI 35.43 kg/m   Wt Readings from Last 3 Encounters:  03/31/24 243 lb 6.4 oz (110.4 kg)  09/27/23 241 lb 12.8 oz (109.7 kg)  03/28/23 247 lb 12.8 oz (112.4 kg)    General appearance: alert, no distress, WD/WN, Caucasian female Skin: scattered macules, no worrisome lesions HEENT: normocephalic, conjunctiva/corneas normal, sclerae anicteric, PERRLA, EOMi Neck: supple, no lymphadenopathy, no thyromegaly, no masses, normal ROM, no bruits Chest: non tender, normal shape and expansion Heart: RRR, normal S1, S2, no murmurs Lungs: CTA bilaterally, no wheezes, rhonchi, or rales Abdomen: +bs, soft, non tender, non distended, no masses, no hepatomegaly, no splenomegaly, no bruits Back: non tender, normal ROM, no scoliosis Musculoskeletal: upper extremities non tender, no obvious deformity, normal ROM throughout, lower extremities non tender, no obvious deformity, normal ROM throughout Extremities: mild LE varicose veins, no edema, no cyanosis, no clubbing Pulses: 2+ symmetric, upper and lower extremities, normal cap refill Neurological: alert, oriented x 3, CN2-12 intact, strength normal upper extremities and lower extremities, sensation normal throughout, DTRs 2+ throughout, no cerebellar signs, gait normal Psychiatric: normal affect, behavior normal, pleasant  Breast/gyn/rectal - deferred /declined    Assessment and Plan :   Encounter Diagnoses  Name Primary?   Encounter for health maintenance examination in adult Yes   S/P hysterectomy    Vaccine counseling    Post-menopausal    Osteopenia, unspecified location    Impaired fasting blood sugar    Pure hypercholesterolemia    Essential hypertension, benign    Advanced directives, counseling/discussion    Screening for hematuria or proteinuria    BMI 35.0-35.9,adult    Morbid obesity (HCC)    Skin lesion        This  visit was a preventative care visit, also known as wellness visit or routine physical.   Topics typically include healthy lifestyle, diet, exercise, preventative care, vaccinations, sick and well care, proper use of emergency dept and after hours care, as well as other concerns.     Recommendations: Continue to return yearly for your annual wellness and preventative care visits.  This gives us  a chance to discuss healthy lifestyle, exercise, vaccinations, review your chart record, and perform screenings where appropriate.  I recommend you see your eye doctor yearly for routine vision care.  I recommend you  see your dentist yearly for routine dental care including hygiene visits twice yearly.   Vaccination recommendations were reviewed Immunization History  Administered Date(s) Administered   Fluad Trivalent(High Dose 65+) 09/27/2023   Influenza, High Dose Seasonal PF 08/22/2019   PFIZER(Purple Top)SARS-COV-2 Vaccination 12/25/2019, 01/19/2020, 08/16/2020   Pneumococcal Conjugate-13 10/07/2019   Pneumococcal Polysaccharide-23 01/27/2021   Zoster Recombinant(Shingrix) 07/21/2022, 10/13/2022    I recommend you get tetanus Tdap vaccine at the pharmacy.   Screening for cancer: Colon cancer screening: I reviewed your colonoscopy on file that is up to date . Due repeat colonoscopy 11/2024  Breast cancer screening: You should perform a self breast exam monthly.   We reviewed recommendations for regular mammograms and breast cancer screening.  Cervical cancer screening: Status post total hysterectomy   Skin cancer screening: Check your skin regularly for new changes, growing lesions, or other lesions of concern Come in for evaluation if you have skin lesions of concern.  Lung cancer screening: If you have a greater than 20 pack year history of tobacco use, then you may qualify for lung cancer screening with a chest CT scan.   Please call your insurance company to inquire about coverage  for this test.  We currently don't have screenings for other cancers besides breast, cervical, colon, and lung cancers.  If you have a strong family history of cancer or have other cancer screening concerns, please let me know.    Bone health: Get at least 150 minutes of aerobic exercise weekly Get weight bearing exercise at least once weekly Bone density test:  A bone density test is an imaging test that uses a type of X-ray to measure the amount of calcium  and other minerals in your bones. The test may be used to diagnose or screen you for a condition that causes weak or thin bones (osteoporosis), predict your risk for a broken bone (fracture), or determine how well your osteoporosis treatment is working. The bone density test is recommended for females 65 and older, or females or males <65 if certain risk factors such as thyroid disease, long term use of steroids such as for asthma or rheumatological issues, vitamin D  deficiency, estrogen deficiency, family history of osteoporosis, self or family history of fragility fracture in first degree relative.  Counseled on weightbearing exercise.  I reviewed her bone density test from 2022 that showed osteopenia.  Lets plan repeat bone density test 09/2023  Follow up as planned for bone density test next month 04/2024.   Heart health: Get at least 150 minutes of aerobic exercise weekly Limit alcohol It is important to maintain a healthy blood pressure and healthy cholesterol numbers  Heart disease screening: Screening for heart disease includes screening for blood pressure, fasting lipids, glucose/diabetes screening, BMI height to weight ratio, reviewed of smoking status, physical activity, and diet.    Goals include blood pressure 120/80 or less, maintaining a healthy lipid/cholesterol profile, preventing diabetes or keeping diabetes numbers under good control, not smoking or using tobacco products, exercising most days per week or at least  150 minutes per week of exercise, and eating healthy variety of fruits and vegetables, healthy oils, and avoiding unhealthy food choices like fried food, fast food, high sugar and high cholesterol foods.    I reviewed her CT coronary calcium  score of 0 May 2022.   Medical care options: I recommend you continue to seek care here first for routine care.  We try really hard to have available appointments Monday through Friday daytime hours  for sick visits, acute visits, and physicals.  Urgent care should be used for after hours and weekends for significant issues that cannot wait till the next day.  The emergency department should be used for significant potentially life-threatening emergencies.  The emergency department is expensive, can often have long wait times for less significant concerns, so try to utilize primary care, urgent care, or telemedicine when possible to avoid unnecessary trips to the emergency department.  Virtual visits and telemedicine have been introduced since the pandemic started in 2020, and can be convenient ways to receive medical care.  We offer virtual appointments as well to assist you in a variety of options to seek medical care.   Advanced Directives: She brought her advanced directives today.  We will scan into chart record.   Separate significant issues discussed:  Hyperlipidemia - labs today, will likely need to restart statin.  She had stopped statin 6 months thinking she had myopathy but that didn't resolve the problem until she quit using stationary bike months later.  HTN with white coat hypertension - labs today, continue current medicaiton, Atenolol  50mg  daily.   Home BPs are fine despite initial high reading here.  Home readings all normal as reviewed.   BMI 35 with comorbidities -  work on efforts to lose weight through healthy diet and exercise.   Refer to dermatology for skin surveillance and skin tags.  She prefers different office than prior [facility]  Impaired fasting glucose - labs today  Omnia "Stephenie Einstein" was seen today for annual exam.  Diagnoses and all orders for this visit:  Encounter for health maintenance examination in adult -     Comprehensive metabolic panel with GFR -     CBC -     Lipid panel -     Hemoglobin A1c -     Urinalysis, Routine w reflex microscopic -     TSH  S/P hysterectomy  Vaccine counseling  Post-menopausal  Osteopenia, unspecified location  Impaired fasting blood sugar -     Hemoglobin A1c  Pure hypercholesterolemia -     Lipid panel  Essential hypertension, benign  Advanced directives, counseling/discussion  Screening for hematuria or proteinuria -     Urinalysis, Routine w reflex microscopic  BMI 35.0-35.9,adult  Morbid obesity (HCC)  Skin lesion -     Ambulatory referral to Dermatology    Follow-up pending labs, yearly for physical

## 2024-03-31 NOTE — Addendum Note (Signed)
 Addended by: Charliene Conte A on: 03/31/2024 08:47 AM   Modules accepted: Orders

## 2024-04-01 ENCOUNTER — Ambulatory Visit: Payer: Self-pay | Admitting: Medical

## 2024-04-01 LAB — COMPREHENSIVE METABOLIC PANEL WITH GFR
ALT: 15 IU/L (ref 0–32)
AST: 18 IU/L (ref 0–40)
Albumin: 4.4 g/dL (ref 3.9–4.9)
Alkaline Phosphatase: 82 IU/L (ref 44–121)
BUN/Creatinine Ratio: 16 (ref 12–28)
BUN: 14 mg/dL (ref 8–27)
Bilirubin Total: 0.4 mg/dL (ref 0.0–1.2)
CO2: 19 mmol/L — ABNORMAL LOW (ref 20–29)
Calcium: 9.5 mg/dL (ref 8.7–10.3)
Chloride: 109 mmol/L — ABNORMAL HIGH (ref 96–106)
Creatinine, Ser: 0.9 mg/dL (ref 0.57–1.00)
Globulin, Total: 2.2 g/dL (ref 1.5–4.5)
Glucose: 106 mg/dL — ABNORMAL HIGH (ref 70–99)
Potassium: 4.2 mmol/L (ref 3.5–5.2)
Sodium: 144 mmol/L (ref 134–144)
Total Protein: 6.6 g/dL (ref 6.0–8.5)
eGFR: 69 mL/min/{1.73_m2} (ref >59)

## 2024-04-01 LAB — CBC
Hematocrit: 46.4 % (ref 34.0–46.6)
Hemoglobin: 15.1 g/dL (ref 11.1–15.9)
MCH: 30.3 pg (ref 26.6–33.0)
MCHC: 32.5 g/dL (ref 31.5–35.7)
MCV: 93 fL (ref 79–97)
Platelets: 214 10*3/uL (ref 150–450)
RBC: 4.99 x10E6/uL (ref 3.77–5.28)
RDW: 12.6 % (ref 11.7–15.4)
WBC: 5.7 10*3/uL (ref 3.4–10.8)

## 2024-04-01 LAB — TSH: TSH: 1.31 u[IU]/mL (ref 0.450–4.500)

## 2024-04-01 LAB — LIPID PANEL
Chol/HDL Ratio: 4.5 ratio — ABNORMAL HIGH (ref 0.0–4.4)
Cholesterol, Total: 167 mg/dL (ref 100–199)
HDL: 37 mg/dL — ABNORMAL LOW (ref >39)
LDL Chol Calc (NIH): 107 mg/dL — ABNORMAL HIGH (ref 0–99)
Triglycerides: 128 mg/dL (ref 0–149)
VLDL Cholesterol Cal: 23 mg/dL (ref 5–40)

## 2024-04-01 LAB — HEMOGLOBIN A1C
Est. average glucose Bld gHb Est-mCnc: 105 mg/dL
Hgb A1c MFr Bld: 5.3 % (ref 4.8–5.6)

## 2024-04-03 ENCOUNTER — Other Ambulatory Visit: Payer: Self-pay | Admitting: Medical

## 2024-04-03 MED ORDER — ATENOLOL 50 MG PO TABS: 50.0000 mg | ORAL_TABLET | Freq: Every day | ORAL | 3 refills | Status: AC

## 2024-04-03 MED ORDER — ROSUVASTATIN CALCIUM 10 MG PO TABS: 10.0000 mg | ORAL_TABLET | Freq: Every day | ORAL | 3 refills | Status: AC

## 2024-04-03 NOTE — Progress Notes (Signed)
 Results sent through MyChart

## 2024-05-05 ENCOUNTER — Other Ambulatory Visit: Payer: Medicare PPO

## 2024-05-28 DIAGNOSIS — L918 Other hypertrophic disorders of the skin: Secondary | ICD-10-CM | POA: Diagnosis not present

## 2024-05-28 DIAGNOSIS — D229 Melanocytic nevi, unspecified: Secondary | ICD-10-CM | POA: Diagnosis not present

## 2024-06-16 ENCOUNTER — Ambulatory Visit (HOSPITAL_BASED_OUTPATIENT_CLINIC_OR_DEPARTMENT_OTHER)
Admission: RE | Admit: 2024-06-16 | Discharge: 2024-06-16 | Disposition: A | Discharge status: Home / Self Care | Source: Ambulatory Visit | Attending: Medical | Admitting: Medical

## 2024-06-16 DIAGNOSIS — Z780 Asymptomatic menopausal state: Secondary | ICD-10-CM | POA: Diagnosis not present

## 2024-06-16 DIAGNOSIS — Z0000 Encounter for general adult medical examination without abnormal findings: Secondary | ICD-10-CM | POA: Diagnosis not present

## 2024-06-16 DIAGNOSIS — M8580 Other specified disorders of bone density and structure, unspecified site: Secondary | ICD-10-CM | POA: Insufficient documentation

## 2024-06-16 DIAGNOSIS — M8589 Other specified disorders of bone density and structure, multiple sites: Secondary | ICD-10-CM | POA: Diagnosis not present

## 2024-06-17 ENCOUNTER — Ambulatory Visit: Payer: Self-pay | Admitting: Medical

## 2024-06-17 NOTE — Progress Notes (Signed)
Results through My Chart

## 2024-07-04 ENCOUNTER — Ambulatory Visit: Admitting: Medical

## 2024-07-04 ENCOUNTER — Encounter: Payer: Self-pay | Admitting: Medical

## 2024-07-04 VITALS — BP 127/77 | HR 85 | Wt 240.8 lb

## 2024-07-04 DIAGNOSIS — R7301 Impaired fasting glucose: Secondary | ICD-10-CM | POA: Diagnosis not present

## 2024-07-04 DIAGNOSIS — Z6835 Body mass index (BMI) 35.0-35.9, adult: Secondary | ICD-10-CM | POA: Diagnosis not present

## 2024-07-04 DIAGNOSIS — I10 Essential (primary) hypertension: Secondary | ICD-10-CM

## 2024-07-04 DIAGNOSIS — E7800 Pure hypercholesterolemia, unspecified: Secondary | ICD-10-CM

## 2024-07-04 NOTE — Progress Notes (Signed)
 Subjective:  [name] is a 73 y.o. female who presents for Chief Complaint  Patient presents with   Follow-up    Follow up. Wants to know if she can stay off her cholesterol medication.      Here for medication management and follow-up.  She was here back in May for a physical.  At that time her blood sugar was elevated.  Her cholesterol was not at goal.  We asked her to restart her cholesterol medicine but she has not taken it since November of last year.  She is compliant with atenolol  50 mg daily. Home BPs recently include 113/77, 115/67, 124/74, 127/77 and other good numbers.  She takes supplements including calcium  and vitamin D   Prior cholesterol medication prescribed was rosuvastatin  Crestor  10 mg daily.  No other aggravating or relieving factors.    No other c/o.  Past Medical History:  Diagnosis Date   Allergy    Arthritis    Blindness of left eye    partial blindness   Endometrial adenocarcinoma (HCC)    Stage IB grade 2   Family history of malignant hyperthermia    brother   History of brachytherapy Aug. 8, 14, 17, 29 Sept. 4   Intracavitary/Iridium 192  (3000 cGy)   History of radiation therapy 06/20/13/17/29/13&07/17/12   HDR endometrial adenocarcinoma   Hyperlipidemia    Hypertension 08/2019   Obesity    Osteopenia    Retinal detachment    Current Outpatient Medications on File Prior to Visit  Medication Sig Dispense Refill   Ascorbic Acid (VITAMIN C) 1000 MG tablet      atenolol  (TENORMIN ) 50 MG tablet Take 1 tablet (50 mg total) by mouth daily. 90 tablet 3   Calcium  Carbonate (CALCIUM  600 PO) Take 1 tablet by mouth daily.     D-1000 EXTRA STRENGTH 25 MCG (1000 UT) tablet TAKE 1 TABLET BY MOUTH EVERY DAY 90 tablet 3   Multiple Vitamins-Minerals (CENTRUM ADULTS) TABS      rosuvastatin  (CRESTOR ) 10 MG tablet Take 1 tablet (10 mg total) by mouth daily. 90 tablet 3   No current facility-administered medications on file prior to visit.     The  following portions of the patient's history were reviewed and updated as appropriate: allergies, current medications, past family history, past medical history, past social history, past surgical history and problem list.  ROS Otherwise as in subjective above    Objective: BP 127/77   Pulse 85   Wt 240 lb 12.8 oz (109.2 kg)   SpO2 98%   BMI 35.05 kg/m   Wt Readings from Last 3 Encounters:  07/04/24 240 lb 12.8 oz (109.2 kg)  03/31/24 243 lb 6.4 oz (110.4 kg)  09/27/23 241 lb 12.8 oz (109.7 kg)   BP Readings from Last 3 Encounters:  07/04/24 127/77  03/31/24 (!) 140/80  09/27/23 120/80   General appearance: alert, no distress, well developed, well nourished    Assessment: Encounter Diagnoses  Name Primary?   Essential hypertension, benign Yes   BMI 35.0-35.9,adult    Pure hypercholesterolemia    Impaired fasting blood sugar    White coat syndrome with diagnosis of hypertension      Plan: Hyperlipidemia, high cholesterol CT coronary calcium  score of 0 May 2022 thankfully. LDL cholesterol 107 in May and HDL 37, low Continue eating healthy oils, health diet, but increase exercise intensity She wants to remain off statin given normal CT coronary, no atherosclerosis.  Updated lipid panel today  BMI greater than 35 I recommend significant efforts to lose weight through healthy diet and exercise Get exercise 4 to 5 days/week at least 150 minutes/week Lower your calorie intake Consider referral to nutritionist or weight management clinic   Hypertension Continue Atenolol  50mg  daily Continue home BP monitoring Your home numbers are at goal   Impaired glucose Hemoglobin A1c 5.3% in May Continue efforts through healthy diet and exercise   [name] was seen today for follow-up.  Diagnoses and all orders for this visit:  Essential hypertension, benign  BMI 35.0-35.9,adult  Pure hypercholesterolemia -     Lipid panel  Impaired fasting blood  sugar  White coat syndrome with diagnosis of hypertension    Follow up: Yearly for physical

## 2024-07-05 LAB — LIPID PANEL
Chol/HDL Ratio: 4.3 ratio (ref 0.0–4.4)
Cholesterol, Total: 162 mg/dL (ref 100–199)
HDL: 38 mg/dL — ABNORMAL LOW (ref >39)
LDL Chol Calc (NIH): 104 mg/dL — ABNORMAL HIGH (ref 0–99)
Triglycerides: 111 mg/dL (ref 0–149)
VLDL Cholesterol Cal: 20 mg/dL (ref 5–40)

## 2024-07-06 ENCOUNTER — Ambulatory Visit: Payer: Self-pay | Admitting: Medical

## 2024-07-06 NOTE — Progress Notes (Signed)
 Results thru my chart

## 2024-10-28 ENCOUNTER — Ambulatory Visit: Admitting: Medical

## 2024-10-28 VITALS — BP 144/72 | HR 68 | Wt 233.4 lb

## 2024-10-28 DIAGNOSIS — M461 Sacroiliitis, not elsewhere classified: Secondary | ICD-10-CM

## 2024-10-28 DIAGNOSIS — M7918 Myalgia, other site: Secondary | ICD-10-CM | POA: Diagnosis not present

## 2024-10-28 MED ORDER — IBUPROFEN 600 MG PO TABS: 600.0000 mg | ORAL_TABLET | Freq: Two times a day (BID) | ORAL | 0 refills | Status: DC

## 2024-10-28 NOTE — Progress Notes (Signed)
 Subjective:  [name] is a 73 y.o. female who presents for Chief Complaint  Patient presents with   Hip Pain    Pt c/o hip pain, not spasms, uncomfortable at night or when resting, its a sharp and dull ache, patient L side hip, pain was initially localized to hip, pain seems to subside when actively moving and not resting      [name] is a 73 year old female who presents with left hip pain.  She has been experiencing left hip pain for approximately a week and a half. Initially, the pain was noticeable while walking and she thought it would resolve on its own. However, the pain has progressively worsened, especially with decreased activity. The pain is located in the left hip, radiating around to the side and into the groin, particularly after stretching exercises.  The pain does not extend into the lower leg and there is no numbness or tingling. It is not exacerbated by bending over to touch her toes and feels better when she is bending and stretching. However, the pain intensifies when she stops stretching.  She has been taking acetaminophen 500 mg, which provided no relief. She reports sleeping only about an hour last night due to pain. This morning, she switched to Tylenol 8 Hour and feels significantly better.  The pain is described as a dull ache and is present even without pressure. She describes pain in the left hip area, with occasional sharp shooting pain upon certain movements.  She attributes the onset of her pain to prolonged sitting on a poorly supportive couch while binge-watching a Netflix series.  No other aggravating or relieving factors.    No other c/o.  Past Medical History:  Diagnosis Date   Allergy    Arthritis    Blindness of left eye    partial blindness   Endometrial adenocarcinoma (HCC)    Stage IB grade 2   Family history of malignant hyperthermia    brother   History of brachytherapy Aug. 8, 14, 17, 29 Sept. 4    Intracavitary/Iridium 192  (3000 cGy)   History of radiation therapy 06/20/13/17/29/13&07/17/12   HDR endometrial adenocarcinoma   Hyperlipidemia    Hypertension 08/2019   Obesity    Osteopenia    Retinal detachment    Current Outpatient Medications on File Prior to Visit  Medication Sig Dispense Refill   Ascorbic Acid (VITAMIN C) 1000 MG tablet      atenolol  (TENORMIN ) 50 MG tablet Take 1 tablet (50 mg total) by mouth daily. 90 tablet 3   Calcium  Carbonate (CALCIUM  600 PO) Take 1 tablet by mouth daily.     D-1000 EXTRA STRENGTH 25 MCG (1000 UT) tablet TAKE 1 TABLET BY MOUTH EVERY DAY 90 tablet 3   Multiple Vitamins-Minerals (CENTRUM ADULTS) TABS      rosuvastatin  (CRESTOR ) 10 MG tablet Take 1 tablet (10 mg total) by mouth daily. (Patient not taking: Reported on 10/28/2024) 90 tablet 3   No current facility-administered medications on file prior to visit.     The following portions of the patient's history were reviewed and updated as appropriate: allergies, current medications, past family history, past medical history, past social history, past surgical history and problem list.  ROS Otherwise as in subjective above    Objective: BP (!) 144/72   Pulse 68   Wt 233 lb 6.4 oz (105.9 kg)   SpO2 97%   BMI 33.97 kg/m   General appearance: alert, no distress,  well developed, well nourished She is tender mainly in the left sacroiliac region but otherwise back nontender with relatively normal range of motion, no scoliosis, no swelling Nontender in the buttock Legs specific left leg nontender with normal range of motion of hip, no bursa tenderness at all Pulses: 2+ radial pulses, 2+ pedal pulses, normal cap refill Ext: no edema Legs neurovascularly intact    Assessment: Encounter Diagnoses  Name Primary?   Sacroiliitis Yes   Left buttock pain      Plan: Sacroiliitis Left lateral hip and buttock pain likely due to sacroiliitis, with inflammation at the sacroiliac joint.  Hip arthritis and bursitis less likely. - Recommended relative rest and stretching exercises: back, rotational hip, and hamstring stretches. - Prescribed ibuprofen  600 mg twice daily with food for 5-7 days. - Advised alternating ibuprofen  with Tylenol, ensuring a minimum of 4 hours between doses. - Suggested using a supportive chair and taking breaks during prolonged sitting. - Recommended placing pillows under legs while lying in bed to alleviate pressure on the lower back. - If symptoms persist or worsen over the next 2 weeks, refer to Dr. Vita here for potential sacroiliac joint injection.   [name] was seen today for hip pain.  Diagnoses and all orders for this visit:  Sacroiliitis  Left buttock pain    Follow up: As needed

## 2024-10-28 NOTE — Patient Instructions (Signed)
 Sacroiliac Joint Dysfunction: What to Know  Sacroiliac joint dysfunction causes swelling in one or both sides of the sacroiliac (SI) joint. The SI joint is where two bones in the pelvis meet. The two bones are the sacrum, which is the bone at the bottom of your spine, and the ilium, which is the big bone that makes up your hip. SI joint dysfunction can cause a deep ache or burning pain in your lower back. Sometimes, the pain spreads to your butt, hips, or thighs. What are the causes? SI joint dysfunction may be caused by: Pregnancy. When a person is pregnant, extra pressure is put on the SI joints because the pelvis gets wider. Injuries, such as: Car accidents Sports injuries. Work injuries. Having one leg that is shorter than the other. Conditions that affect the joints, such as: Rheumatoid arthritis. Gout. Psoriatic arthritis. Joint infection. Sometimes, the cause of SI joint dysfunction is not known. What are the signs or symptoms? Symptoms of this condition include: Aching or burning pain in the lower back. The pain may also spread to other areas, such as: Butt. Groin. Thighs. Muscle spasms in or around the painful areas. More pain when standing, walking, running, stair climbing, bending, or lifting. How is this diagnosed? Your health care provider will check your medical history and do a physical exam. During the exam, your provider may move your legs to see if it hurts. They may also do tests like: Imaging tests to look for other causes of pain. These may include: MRI. CT scan. Bone scan. Diagnostic injection. Your provider puts numbing medicine into the SI joint. If your pain gets better after this, it may mean you have this condition. How is this treated? Treatment depends on what is causing the problem and how bad it is. Some options include: Ice or heat applied to the lower back area after an injury. This may help reduce pain and muscle spasms. Medicines to help with  pain and swelling, or to relax the muscles. Wearing a back brace, called an SI brace, to support your back while it heals. Physical therapy to help the muscles around the joint be more strong and improve movement. You may also learn how to move your body in ways that are easier on the joint. Direct manipulation. A therapist may gently move the SI joint to help it work better. Using a device that provides electrical stimulation to help reduce pain at the joint. Other treatments may include: Steroid injections. These can help reduce pain and swelling in the joint. Radiofrequency ablation. This uses heat to burn away nerves that are carrying pain messages from the joint. Surgery to put in screws and plates that limit or prevent joint motion. This is rare. Follow these instructions at home: Medicines Take your medicines only as told. You may need to take these steps to help prevent or treat trouble pooping (constipation): Take medicines to help you poop. Eat foods high in fiber, like beans, whole grains, and fresh fruits and vegetables. Drink more fluids as told. Ask your provider if it's safe to drive or use machines while taking your medicine. If you have a brace: Wear the brace as told. Take it off only if your provider says you can. Keep the brace clean. If the brace is not waterproof: Do not let it get wet. Managing pain, stiffness, and swelling     Icing can help with pain and swelling. Heat may help with muscle tension or spasms. Ask your provider if you  should use ice or heat. Use ice or an ice pack as told. Place a towel between your skin and the ice. Leave the ice on for 20 minutes, 2-3 times a day. Use heat as told. Use the heat source that your provider recommends, such as a moist heat pack or a heating pad. Do this as often as told. Place a towel between your skin and the heat source. Leave the heat on for 20-30 minutes. If your skin turns red, take off the ice or heat  right away to prevent skin damage. The risk of damage is higher if you can't feel pain, heat, or cold. General instructions Rest as told. Ask what things are safe for you to do at home. Ask when you can go back to work or school. Exercise as told. Contact a health care provider if: Your pain is not controlled with medicine. You have a fever. Your pain is getting worse. Get help right away if: You have weakness, numbness, or tingling in your legs or feet. You lose control of your bladder or bowels. This information is not intended to replace advice given to you by your health care provider. Make sure you discuss any questions you have with your health care provider. Document Revised: 10/05/2023 Document Reviewed: 10/05/2023 Elsevier Patient Education  2025 [employer].

## 2024-10-31 ENCOUNTER — Telehealth: Payer: Self-pay

## 2024-10-31 ENCOUNTER — Other Ambulatory Visit: Payer: Self-pay | Admitting: Medical

## 2024-10-31 MED ORDER — PREDNISONE 10 MG PO TABS: ORAL_TABLET | ORAL | 0 refills | Status: DC

## 2024-10-31 MED ORDER — HYDROCODONE-ACETAMINOPHEN 7.5-325 MG PO TABS: 1.0000 | ORAL_TABLET | Freq: Four times a day (QID) | ORAL | 0 refills | Status: AC | PRN

## 2024-10-31 NOTE — Telephone Encounter (Signed)
 See mychart note

## 2024-10-31 NOTE — Telephone Encounter (Signed)
 Copied from CRM #[id number]. Topic: Clinical - Medication Question >> Oct 31, 2024  4:39 PM Delon HERO wrote: Reason for CRM: Patient did receive her script for predniSONE (DELTASONE) 10 MG tablet [[id number]. However the  Sig was not printed. Patient would like to know how to take the medication.

## 2024-11-07 ENCOUNTER — Encounter: Payer: Self-pay | Admitting: Family Medicine

## 2024-11-07 ENCOUNTER — Ambulatory Visit: Payer: Self-pay

## 2024-11-07 ENCOUNTER — Ambulatory Visit: Admitting: Family Medicine

## 2024-11-07 ENCOUNTER — Ambulatory Visit
Admission: RE | Admit: 2024-11-07 | Discharge: 2024-11-07 | Disposition: A | Discharge status: Home / Self Care | Source: Ambulatory Visit | Attending: Family Medicine | Admitting: Family Medicine

## 2024-11-07 VITALS — BP 140/92 | HR 67 | Wt 232.8 lb

## 2024-11-07 DIAGNOSIS — M461 Sacroiliitis, not elsewhere classified: Secondary | ICD-10-CM

## 2024-11-07 DIAGNOSIS — M25552 Pain in left hip: Secondary | ICD-10-CM

## 2024-11-07 MED ORDER — MELOXICAM 15 MG PO TABS: 15.0000 mg | ORAL_TABLET | Freq: Every day | ORAL | 0 refills | Status: DC

## 2024-11-07 MED ORDER — TRAMADOL HCL 50 MG PO TABS: 50.0000 mg | ORAL_TABLET | Freq: Three times a day (TID) | ORAL | 0 refills | Status: AC | PRN

## 2024-11-07 NOTE — Progress Notes (Signed)
" ° °  Name: Tressy Kunzman   Date of Visit: 11/07/2024   Date of last visit with me: Visit date not found    CHIEF COMPLAINT:  Chief Complaint  Patient presents with   Acute Visit    Hip pain. Tysinger diagnosis her with SI inflammation.  Was put on prednisone  and did start to help, was doing stretches and something popped and immediate relief. Came back on christmas eve.        HPI:  Discussed the use of AI scribe software for clinical note transcription with the patient, who gave verbal consent to proceed.  History of Present Illness   [name] is a 73 year old female who presents with left hip pain.  She experiences significant pain in her left hip, radiating down to her knee with a burning sensation. Initially, she also had pain in the groin area. The pain has been persistent and unresponsive to ibuprofen  and Tylenol .  She began taking prednisone , which initially alleviated her symptoms, providing relief for about four days. However, the pain returned on Christmas Eve. She has been using hydrocodone  for pain management, which provides temporary relief for about two hours.  She has been unable to sleep on her left side due to the pain, affecting her sleep quality. She sleeps on her right side but finds it uncomfortable over time.  She mentions a recent period of inactivity, including two days of binge-watching television, which she believes may have contributed to her current condition. She has not been using her spin cycle or treadmill recently.  During the review of symptoms, she reports being very sensitive to touch, with pain primarily in the knee area. No significant pain on the right side.         OBJECTIVE:       03/31/2024    8:12 AM  Depression screen PHQ 2/9  Decreased Interest 0  Down, Depressed, Hopeless 0  PHQ - 2 Score 0     BP Readings from Last 3 Encounters:  11/07/24 (!) 140/92  10/28/24 (!) 144/72  07/04/24 127/77     BP (!) 140/92   Pulse 67   Wt 232 lb 12.8 oz (105.6 kg)   SpO2 97%   BMI 33.89 kg/m    Physical Exam   MUSCULOSKELETAL: Left hip abductor weakness. Left knee pain on palpation. Left hip pain on palpation, manipulation, and resistance testing.      Physical Exam  ASSESSMENT/PLAN:   Assessment & Plan Left hip pain  Sacroiliitis    Assessment and Plan    Left sacroiliitis with hip pain and abductor weakness Pain localized to left hip, knee, and groin. Weakness in hip abductors affects gait and exacerbates sacroiliitis. - Prescribed meloxicam , one tablet daily. - Prescribed tramadol  for pain management. - Ordered hip x-ray at [facility]. - Instructed on daily hip abductor strengthening exercises. - Scheduled follow-up in one week for potential SI joint steroid injection.         Kaspian Muccio A. Vita MD [facility] "

## 2024-11-07 NOTE — Patient Instructions (Addendum)
 Please go get your xrays done at [facility]. You do not need to make an appointment. You can just show up.   Address: [address] [place], [place], [place] [address]   VISIT SUMMARY:  Today, you were seen for significant pain in your left hip that radiates down to your knee, with a burning sensation. You have been experiencing this pain for some time, and it has not responded well to over-the-counter medications. You also mentioned that the pain has affected your sleep and daily activities.  YOUR PLAN:  -LEFT SACROILIITIS WITH HIP PAIN AND ABDUCTOR WEAKNESS: Left sacroiliitis is inflammation of the sacroiliac joint, which connects the lower spine to the pelvis. This condition, along with weakness in the muscles that move your hip away from your body, is causing pain in your left hip, knee, and groin. To manage this, you have been prescribed meloxicam  to take once daily and tramadol  for pain relief. You should also perform daily hip abductor strengthening exercises. An x-ray of your hip has been ordered, and we will follow up in one week to discuss the possibility of a steroid injection into the SI joint.  INSTRUCTIONS:  Please take meloxicam  as prescribed, one tablet daily, and use tramadol  for pain management as needed. Perform the hip abductor strengthening exercises daily. Get the hip x-ray done at [facility] as soon as possible. We will see you in one week to evaluate your progress and discuss further treatment options, including a potential SI joint steroid injection.

## 2024-11-07 NOTE — Telephone Encounter (Signed)
 FYI Only or Action Required?: FYI only for provider: appointment scheduled on 11/07/24.  Patient was last seen in primary care on 10/28/2024 by Bulah Alm RAMAN, PA-C.  Called Nurse Triage reporting Hip Pain.  Symptoms began a week ago.  Interventions attempted: Prescription medications: hydrocodone , ice.  Symptoms are: gradually worsening.  Triage Disposition: See PCP When Office is Open (Within 3 Days)  Patient/caregiver understands and will follow disposition?:    Copied from CRM #[id number]. Topic: Clinical - Red Word Triage >> Nov 07, 2024  8:32 AM Delon HERO wrote: Red Word that prompted transfer to Nurse Triage: Patient is calling to report severe hip, buttocks down to knee ( Left). Ludie dx has SJI. Last seen 10/28/24 Took  hydrocodone  12:16am - woke up at 12:30a in pain. Reason for Disposition  [1] MODERATE pain (e.g., interferes with normal activities, limping) AND [2] present > 3 days  Answer Assessment - Initial Assessment Questions 1. LOCATION and RADIATION: Where is the pain located? Does the pain spread (shoot) anywhere else?     Left buttock/ hip 2. QUALITY: What does the pain feel like?  (e.g., sharp, dull, aching, burning)     Dull/ burning 3. SEVERITY: How bad is the pain? What does it keep you from doing?   (Scale 1-10; or mild, moderate, severe)     7/10 4. ONSET: When did the pain start? Does it come and go, or is it there all the time?     ongoing 5. WORK OR EXERCISE: Has there been any recent work or exercise that involved this part of the body?      no  7. AGGRAVATING FACTORS: What makes the hip pain worse? (e.g., walking, climbing stairs, running)     All activity 8. OTHER SYMPTOMS: Do you have any other symptoms? (e.g., back pain, pain shooting down leg,  fever, rash)     Some pain in leg. Denies numbness  Protocols used: Hip Pain-A-AH

## 2024-11-14 ENCOUNTER — Encounter: Payer: Self-pay | Admitting: Family Medicine

## 2024-11-14 ENCOUNTER — Ambulatory Visit: Admitting: Family Medicine

## 2024-11-14 ENCOUNTER — Ambulatory Visit
Admission: RE | Admit: 2024-11-14 | Discharge: 2024-11-14 | Disposition: A | Discharge status: Home / Self Care | Source: Ambulatory Visit | Attending: Family Medicine | Admitting: Family Medicine

## 2024-11-14 VITALS — BP 144/82 | HR 67 | Wt 235.4 lb

## 2024-11-14 DIAGNOSIS — M461 Sacroiliitis, not elsewhere classified: Secondary | ICD-10-CM | POA: Diagnosis not present

## 2024-11-14 DIAGNOSIS — M25561 Pain in right knee: Secondary | ICD-10-CM | POA: Diagnosis not present

## 2024-11-14 DIAGNOSIS — G8929 Other chronic pain: Secondary | ICD-10-CM

## 2024-11-14 DIAGNOSIS — M25562 Pain in left knee: Secondary | ICD-10-CM | POA: Diagnosis not present

## 2024-11-14 DIAGNOSIS — M25552 Pain in left hip: Secondary | ICD-10-CM | POA: Diagnosis not present

## 2024-11-14 MED ORDER — TRAMADOL HCL 50 MG PO TABS: 50.0000 mg | ORAL_TABLET | Freq: Three times a day (TID) | ORAL | 0 refills | Status: AC | PRN

## 2024-11-14 NOTE — Progress Notes (Signed)
" ° °  Name: Jamal DELENA Hock   Date of Visit: 11/14/2024   Date of last visit with me: 11/07/2024   CHIEF COMPLAINT:  Chief Complaint  Patient presents with   other    F/u on hip pain, lt. Side doing much better, felt nothing in the hip, does hurt at night slight sore but her knee hurts more at night, also has been taking tramodol at night,        HPI:  Discussed the use of AI scribe software for clinical note transcription with the patient, who gave verbal consent to proceed.  History of Present Illness   Keleigh A Sage Hammill is a 74 year old female with arthritis who presents with knee pain.  She experiences knee pain primarily at night, described as a 'little pinpoint' sensation, which becomes more noticeable with focus. The pain was significant enough last night to require tramadol  at around 12:30 AM. She only takes tramadol  at night and skipped it one night. During the day, she feels much better and can engage in activities such as walking without noticeable pain. However, after extensive walking and exercise, she experienced pain that kept her awake for three hours.  She also experiences some soreness in the hip. The pain sometimes starts in the lower back, radiates to the hip, and then to the knee. She has been taking tramadol , with five tablets taken so far, and uses a compression sleeve for the knee, which she finds helpful. She is also on meloxicam , which she believes is beneficial.  No significant knee pain during the day, but she reports stiffness in the morning and acknowledges soreness in the hip.         OBJECTIVE:       03/31/2024    8:12 AM  Depression screen PHQ 2/9  Decreased Interest 0  Down, Depressed, Hopeless 0  PHQ - 2 Score 0     BP Readings from Last 3 Encounters:  11/14/24 (!) 144/82  11/07/24 (!) 140/92  10/28/24 (!) 144/72    BP (!) 144/82   Pulse 67   Wt 235 lb 6.4 oz (106.8 kg)   BMI 34.26 kg/m    Physical Exam           Physical Exam Constitutional:      Appearance: Normal appearance.  Neurological:     General: No focal deficit present.     Mental Status: She is alert and oriented to person, place, and time. Mental status is at baseline.     ASSESSMENT/PLAN:   Assessment & Plan Sacroiliitis  Left hip pain  Chronic pain of both knees    Assessment and Plan    Osteoarthritis of the knee Intermittent nocturnal knee pain with recent improvement. Possible referred pain and arthritic changes. - Ordered knee x-rays to assess for arthritic changes. - Advised wearing a compression sleeve during activities over 10-15 minutes. - Continue meloxicam  for pain management. - Consider steroid injection if pain becomes daily and unmanageable. - tramadol  as needed.   Osteoarthritis of the left hip Mild osteoarthritis with occasional soreness, less severe than previous episodes. - Continue current management and monitor symptoms.  Sacroiliitis Pain radiating from lower back to hip and knee, improved with current management. - Continue current management and monitor symptoms.         Robben Jagiello A. Vita MD [facility] "

## 2024-11-27 ENCOUNTER — Encounter: Payer: Self-pay | Admitting: Family Medicine

## 2024-12-04 ENCOUNTER — Other Ambulatory Visit: Payer: Self-pay | Admitting: Family Medicine

## 2024-12-11 NOTE — Progress Notes (Signed)
 [name]                                          MRN: [id number]   12/11/2024   The [facility] Quality Team Specialist reviewed this patient medical record for the purposes of chart review for care gap closure. The following were reviewed: chart review for care gap closure-controlling blood pressure.    [facility] Quality Team

## 2025-01-13 ENCOUNTER — Ambulatory Visit

## 2025-01-22 ENCOUNTER — Encounter
